# Patient Record
Sex: Male | Born: 1960 | Race: White | Hispanic: No | Marital: Married | Smoking: Never smoker
Health system: Southern US, Community
[De-identification: ages and names within clinical notes are randomized; demographics above are authoritative.]

## PROBLEM LIST (undated history)

## (undated) DIAGNOSIS — N200 Calculus of kidney: Secondary | ICD-10-CM

## (undated) DIAGNOSIS — I10 Essential (primary) hypertension: Secondary | ICD-10-CM

## (undated) DIAGNOSIS — N35919 Unspecified urethral stricture, male, unspecified site: Secondary | ICD-10-CM

## (undated) HISTORY — PX: APPENDECTOMY: SHX54

## (undated) SURGERY — CYSTOSCOPY, WITH URETHRAL DILATION
Anesthesia: Choice

---

## 1999-08-16 ENCOUNTER — Observation Stay (HOSPITAL_COMMUNITY): Admission: RE | Admit: 1999-08-16 | Discharge: 1999-08-17 | Payer: Self-pay | Admitting: Urology

## 1999-08-16 ENCOUNTER — Encounter (INDEPENDENT_AMBULATORY_CARE_PROVIDER_SITE_OTHER): Payer: Self-pay | Admitting: *Deleted

## 1999-08-16 ENCOUNTER — Encounter: Payer: Self-pay | Admitting: Urology

## 2002-09-03 ENCOUNTER — Inpatient Hospital Stay (HOSPITAL_COMMUNITY): Admission: EM | Admit: 2002-09-03 | Discharge: 2002-09-04 | Payer: Self-pay | Admitting: *Deleted

## 2003-12-12 ENCOUNTER — Inpatient Hospital Stay (HOSPITAL_COMMUNITY): Admission: AD | Admit: 2003-12-12 | Discharge: 2003-12-17 | Payer: Self-pay | Admitting: *Deleted

## 2003-12-15 ENCOUNTER — Emergency Department (HOSPITAL_COMMUNITY): Admission: EM | Admit: 2003-12-15 | Discharge: 2003-12-16 | Payer: Self-pay | Admitting: Emergency Medicine

## 2004-01-23 ENCOUNTER — Encounter: Admission: RE | Admit: 2004-01-23 | Discharge: 2004-01-23 | Payer: Self-pay | Admitting: Psychiatry

## 2004-07-30 ENCOUNTER — Ambulatory Visit (HOSPITAL_COMMUNITY): Payer: Self-pay | Admitting: Psychiatry

## 2004-12-08 ENCOUNTER — Ambulatory Visit (HOSPITAL_COMMUNITY): Payer: Self-pay | Admitting: Psychiatry

## 2004-12-09 ENCOUNTER — Emergency Department (HOSPITAL_COMMUNITY): Admission: EM | Admit: 2004-12-09 | Discharge: 2004-12-09 | Payer: Self-pay | Admitting: Emergency Medicine

## 2005-07-13 ENCOUNTER — Ambulatory Visit (HOSPITAL_COMMUNITY): Payer: Self-pay | Admitting: Psychiatry

## 2006-06-21 ENCOUNTER — Ambulatory Visit (HOSPITAL_COMMUNITY): Payer: Self-pay | Admitting: Psychiatry

## 2006-07-07 ENCOUNTER — Ambulatory Visit (HOSPITAL_BASED_OUTPATIENT_CLINIC_OR_DEPARTMENT_OTHER): Admission: RE | Admit: 2006-07-07 | Discharge: 2006-07-07 | Payer: Self-pay | Admitting: Urology

## 2006-12-27 ENCOUNTER — Ambulatory Visit (HOSPITAL_COMMUNITY): Payer: Self-pay | Admitting: Psychiatry

## 2007-07-31 ENCOUNTER — Emergency Department (HOSPITAL_COMMUNITY): Admission: EM | Admit: 2007-07-31 | Discharge: 2007-08-01 | Payer: Self-pay | Admitting: Emergency Medicine

## 2007-08-01 ENCOUNTER — Ambulatory Visit (HOSPITAL_COMMUNITY): Admission: RE | Admit: 2007-08-01 | Discharge: 2007-08-01 | Payer: Self-pay | Admitting: Urology

## 2007-08-01 IMAGING — CT CT PELVIS W/O CM
2 of 3 series · 17 of 46 positions shown, 19 images · non-contrast
Comparison: None

CLINICAL DATA: Right flank pain and dysuria.

ABDOMEN CT WITHOUT CONTRAST
TECHNIQUE: Multidetector CT imaging of the abdomen was performed following the
standard protocol without IV contrast.
TECHNIQUE: Multidetector CT imaging of the pelvis was performed following the

[Series 2: 220 stone 5.0 b40s st · axial · 0.87mm/px · z∈[-506,-158]mm · 14 of 101 slices shown, 16 images]
[im 7/101  soft-tissue]
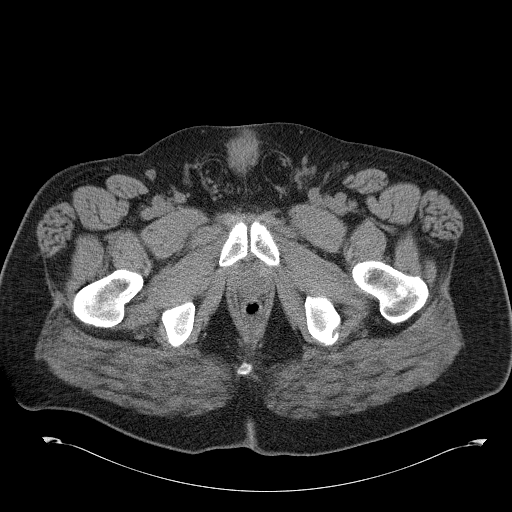
[im 7/101  bone]
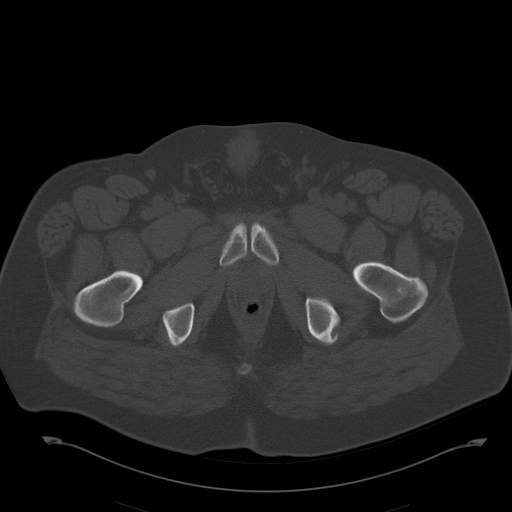
[im 13/101  soft-tissue]
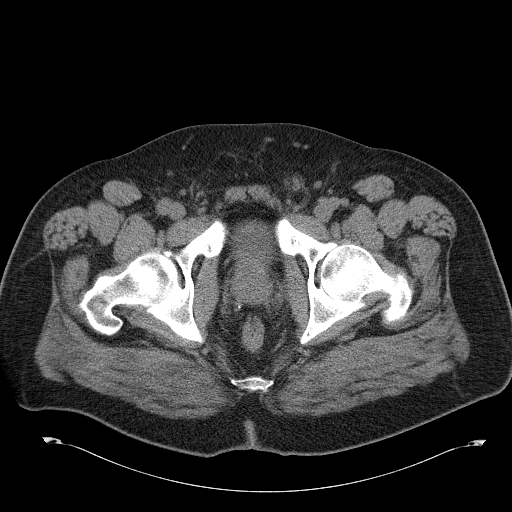
[im 20/101  soft-tissue]
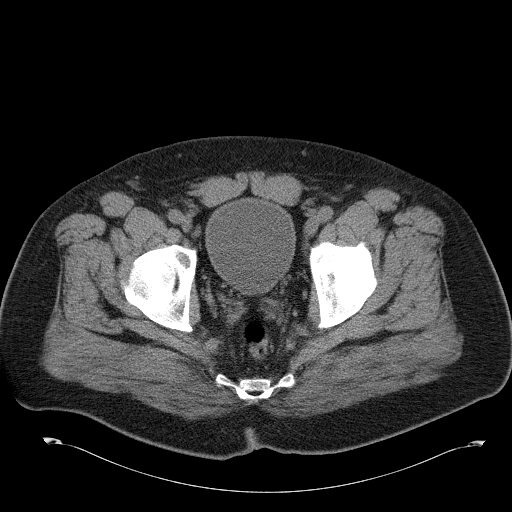
[im 26/101  soft-tissue]
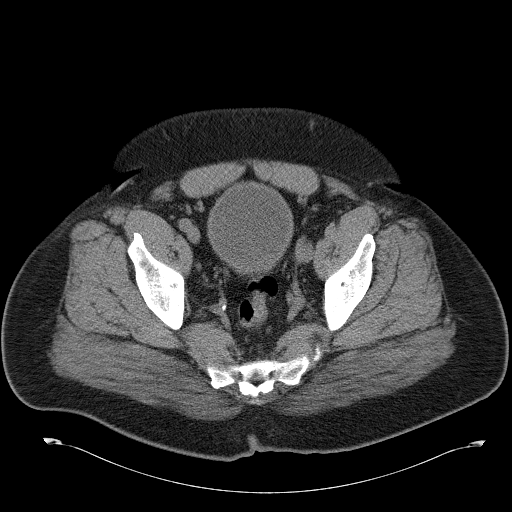
[im 33/101  soft-tissue]
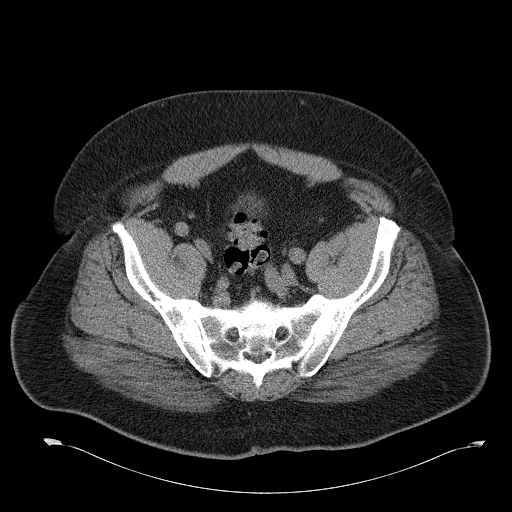
[im 39/101  soft-tissue]
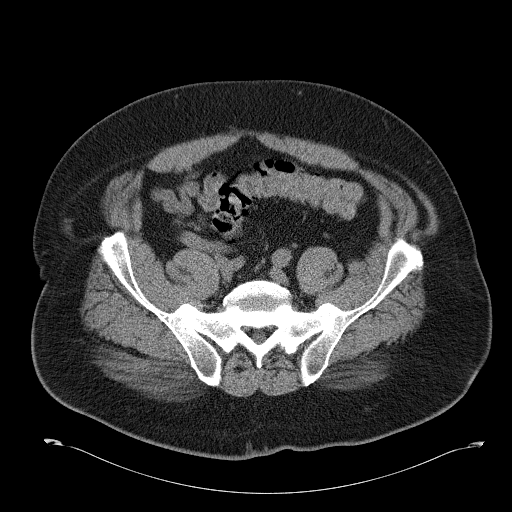
[im 46/101  soft-tissue]
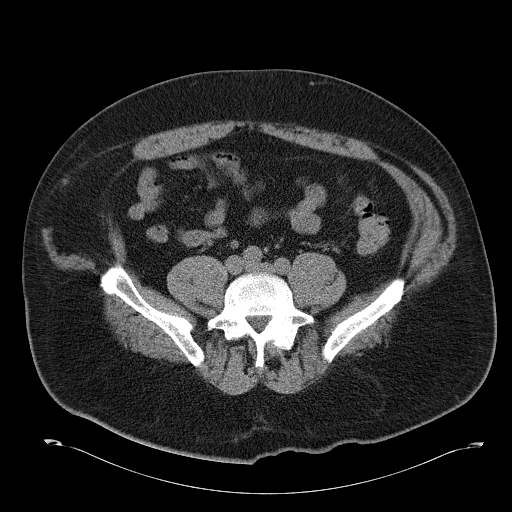
[im 55/101  soft-tissue]
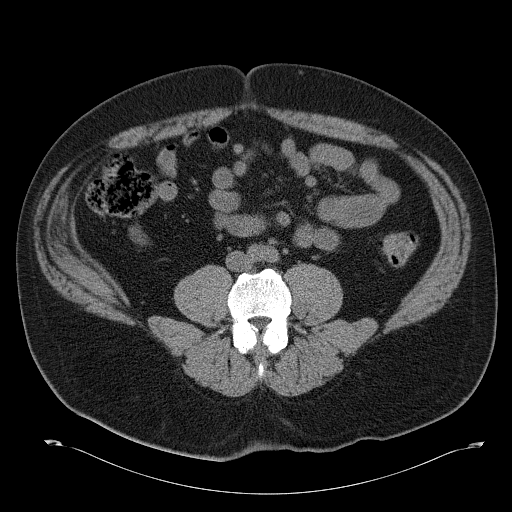
[im 62/101  soft-tissue]
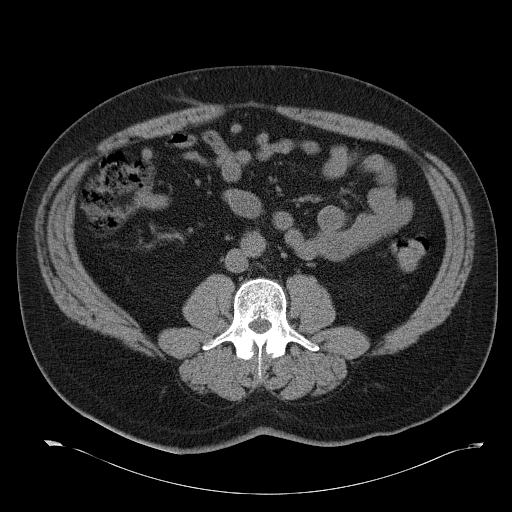
[im 62/101  bone]
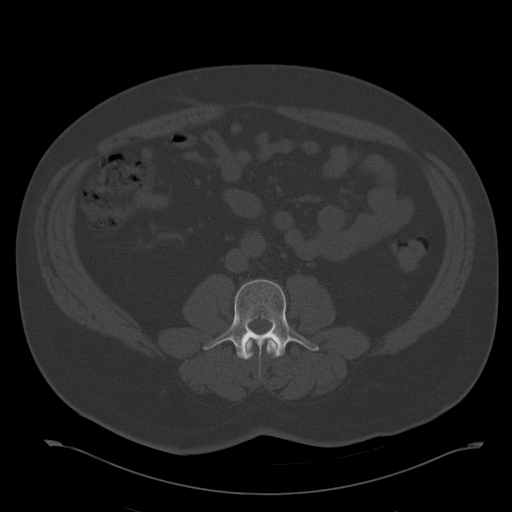
[im 68/101  soft-tissue]
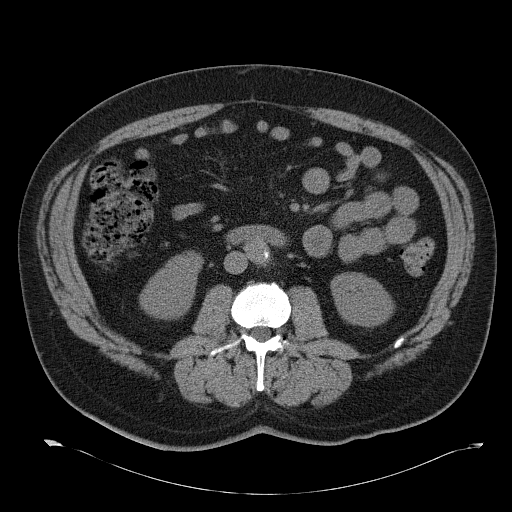
[im 75/101  soft-tissue]
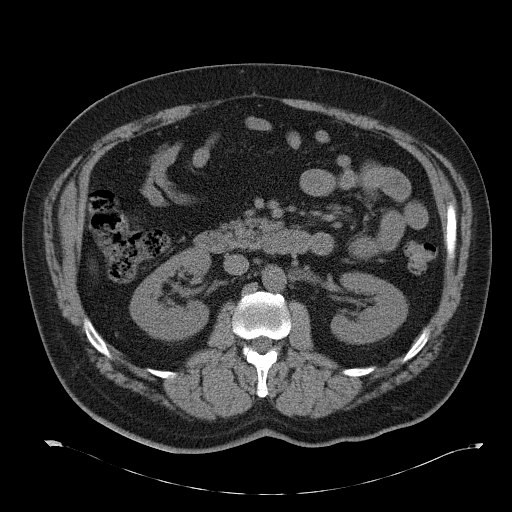
[im 81/101  soft-tissue]
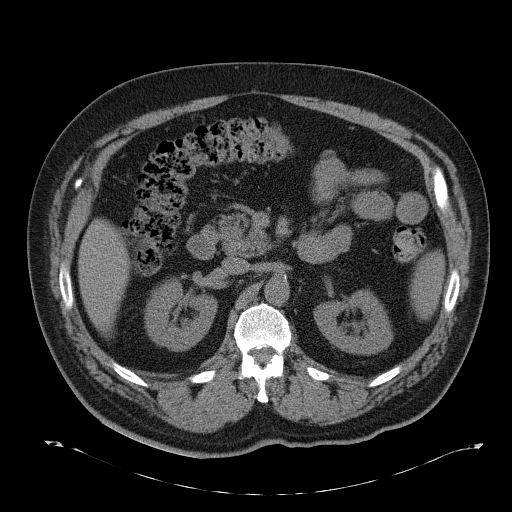
[im 88/101  soft-tissue]
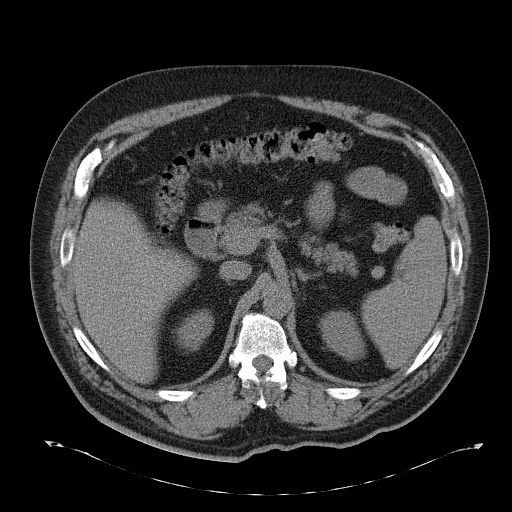
[im 94/101  soft-tissue]
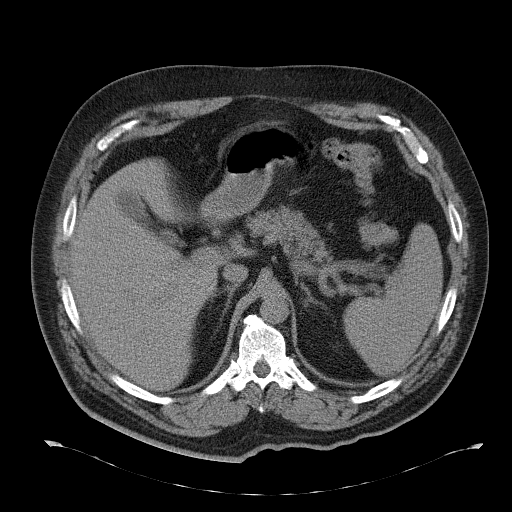

[Series 602: coronal images · coronal · 0.87mm/px · 3 of 90 slices shown]
[im 30/90  soft-tissue]
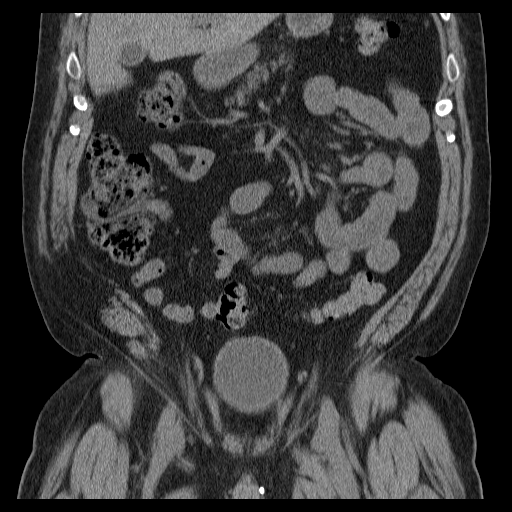
[im 40/90  soft-tissue]
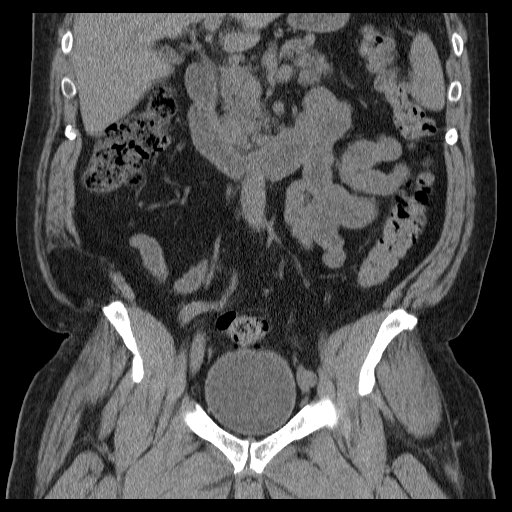
[im 50/90  soft-tissue]
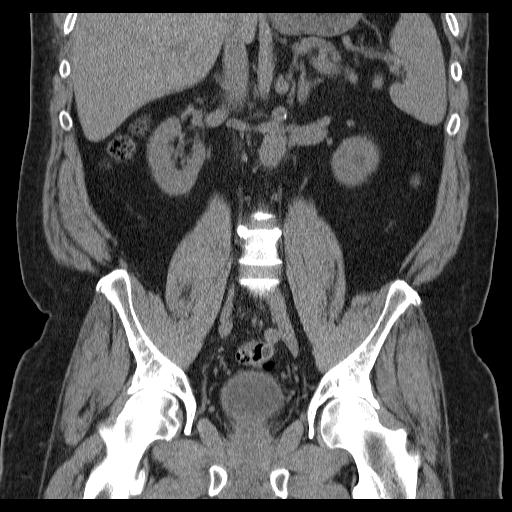

[17 of 46 positions shown; findings below may reference images not displayed]

FINDINGS: The adrenal glands, visualized portion of the liver, visualized
portion of the spleen, and pancreas appear unremarkable in noncontrast CT
appearance. The gallbladder appears mildly contracted. No hydronephrosis or
renal calculi are identified. A faintly hypodense lesion of the left mid kidney
anteriorly measuring 10 mm in diameter is nonspecific. No pathologic
retroperitoneal adenopathy is identified. Tortuosity of the abdominal aorta
noted.

Just above the iliac crest is considerable atrophy of the internal and external
oblique musculature on the right side, with adipose density in this vicinity.
Query prior injury to the lateral abdominal wall musculature in the past. No
herniated bowel is identified.

IMPRESSION

1. Atrophic and indistinct lateral abdominal wall musculature on the right side
inferiorly. Query injury or tear of this musculature in the past.
2. No hydronephrosis, hydroureter, or calculus identified.
3. Faintly hypodense lesion anteriorly in the left mid kidney is probably a
cyst, but technically nonspecific due to the noncontrast nature of today's exam.

PELVIS CT WITHOUT CONTRAST
FINDINGS: The appendix is not well visualized. No definite right lower quadrant
inflammatory process is identified. No distal or neural calculus noted. There is
spurring of the sacroiliac joints. No dilated small bowel. No pathologic pelvic
adenopathy is noted.

IMPRESSION

1. Spurring of both sacroiliac joints.
2. Nonvisualization of the appendix. However, no active right lower quadrant
inflammatory process is identified.

## 2007-08-06 ENCOUNTER — Ambulatory Visit (HOSPITAL_COMMUNITY): Payer: Self-pay | Admitting: Psychiatry

## 2008-06-13 ENCOUNTER — Ambulatory Visit (HOSPITAL_BASED_OUTPATIENT_CLINIC_OR_DEPARTMENT_OTHER): Admission: RE | Admit: 2008-06-13 | Discharge: 2008-06-13 | Payer: Self-pay | Admitting: Urology

## 2009-12-10 ENCOUNTER — Ambulatory Visit (HOSPITAL_BASED_OUTPATIENT_CLINIC_OR_DEPARTMENT_OTHER): Admission: RE | Admit: 2009-12-10 | Discharge: 2009-12-10 | Payer: Self-pay | Admitting: Urology

## 2010-07-04 ENCOUNTER — Ambulatory Visit: Payer: Self-pay | Admitting: Diagnostic Radiology

## 2010-07-04 ENCOUNTER — Emergency Department (HOSPITAL_BASED_OUTPATIENT_CLINIC_OR_DEPARTMENT_OTHER): Admission: EM | Admit: 2010-07-04 | Discharge: 2010-07-05 | Payer: Self-pay | Admitting: Emergency Medicine
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
## 2010-07-05 ENCOUNTER — Ambulatory Visit: Payer: Self-pay | Admitting: Diagnostic Radiology
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
## 2011-01-21 LAB — URINALYSIS, ROUTINE W REFLEX MICROSCOPIC
Hgb urine dipstick: NEGATIVE
Ketones, ur: NEGATIVE mg/dL
Urobilinogen, UA: 1 mg/dL (ref 0.0–1.0)
pH: 6 (ref 5.0–8.0)

## 2011-01-21 LAB — CBC
MCHC: 34.8 g/dL (ref 30.0–36.0)
RBC: 4.43 MIL/uL (ref 4.22–5.81)

## 2011-01-21 LAB — BASIC METABOLIC PANEL
BUN: 13 mg/dL (ref 6–23)
Chloride: 105 mEq/L (ref 96–112)
Creatinine, Ser: 1 mg/dL (ref 0.4–1.5)
GFR calc Af Amer: >60 mL/min (ref >60)
Glucose, Bld: 93 mg/dL (ref 70–99)

## 2011-01-21 LAB — URINE MICROSCOPIC-ADD ON

## 2011-01-26 LAB — POCT I-STAT 4, (NA,K, GLUC, HGB,HCT)
Glucose, Bld: 106 mg/dL — ABNORMAL HIGH (ref 70–99)
HCT: 42 % (ref 39.0–52.0)

## 2011-02-14 ENCOUNTER — Ambulatory Visit (HOSPITAL_COMMUNITY)
Admission: EM | Admit: 2011-02-14 | Discharge: 2011-02-14 | Disposition: A | Discharge status: Home / Self Care | Payer: Federal, State, Local not specified - PPO | Source: Ambulatory Visit | Attending: Urology | Admitting: Urology

## 2011-02-14 DIAGNOSIS — I10 Essential (primary) hypertension: Secondary | ICD-10-CM | POA: Insufficient documentation

## 2011-02-14 DIAGNOSIS — Z01810 Encounter for preprocedural cardiovascular examination: Secondary | ICD-10-CM | POA: Insufficient documentation

## 2011-02-14 DIAGNOSIS — Z01812 Encounter for preprocedural laboratory examination: Secondary | ICD-10-CM | POA: Insufficient documentation

## 2011-02-14 DIAGNOSIS — Z79899 Other long term (current) drug therapy: Secondary | ICD-10-CM | POA: Insufficient documentation

## 2011-02-14 DIAGNOSIS — N35919 Unspecified urethral stricture, male, unspecified site: Secondary | ICD-10-CM | POA: Insufficient documentation

## 2011-02-24 NOTE — Op Note (Signed)
  NAME:  Derek Dean, Derek Dean NO.:  [id number]  MEDICAL RECORD NO.:  [id number]           PATIENT TYPE:  O  LOCATION:  DAY                          FACILITY:  [facility]  PHYSICIAN:  Excell Seltzer. Annabell Howells, M.D.    DATE OF BIRTH:  [birth year]  DATE OF PROCEDURE:  02/14/2011 DATE OF DISCHARGE:                              OPERATIVE REPORT   PROCEDURES: 1. Cystoscopy. 2. Balloon dilation of urethral stricture.  PREOPERATIVE DIAGNOSIS:  Urethral stricture.  POSTOPERATIVE DIAGNOSIS:  Urethral stricture.  SURGEON:  Excell Seltzer. Annabell Howells, M.D.  ANESTHESIA:  General.  DRAIN:  An 18-French Foley catheter.  COMPLICATIONS:  None.  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  He began having increased symptoms over the last few days.  He has taken some Cipro and his urine did not look too bad today but his urethral meatus is largely closed.  FINDINGS OF PROCEDURE:  He was given Cipro and Unasyn and taken to the operating room where general anesthetic was induced.  He was placed in lithotomy position.  His perineum and genitalia were prepped with Betadine solution.  He was draped in usual sterile fashion.  Time-out was performed.  A sensor guidewire was passed through the urethral meatus and advanced sufficient to be in the bladder.  A 5-French open-ended catheter was inserted over the wire and then the wire was removed.  Aspiration through the open-ended catheter produced urine consistent with intravesical positioning.  The wire was then reinserted and the open- ended catheter was removed.  A 15-cm 24-French balloon dilation catheter was then passed over the wire until only the proximal tip protruded from the meatus.  The balloon was then inflated to 25 atmospheres and was held for 3 minutes, then deflated and removed.  Cystoscopy was then performed alongside the  wire.  This revealed disruption of his panurethral stricture disease which exists from the meatus to the membranous urethra.  The prostatic urethra was approximately 2 cm in length with trilobar hyperplasia with minimal obstruction.  Examination of bladder revealed mild trabeculation.  No tumors or stones were noted.  Ureteral orifices were unremarkable.  Once cystoscopy was completed, the guidewire was removed along with the scope and an 18-French Foley catheter was inserted.  A catheter guide was required to negotiate the stricture, it was still somewhat narrow. Once the Foley was in position, the balloon was dilated with 10 cc sterile fluid and placed to straight drainage.  The patient was taken down from lithotomy position.  His anesthetic was reversed, he was moved to the recovery room in stable condition and there were no complications.     Excell Seltzer. Annabell Howells, M.D.     JJW/MEDQ  D:  02/14/2011  T:  02/15/2011  Job:  [id number]  Electronically Signed by Bjorn Pippin M.D. on 02/24/2011 02:15:32 PM

## 2011-03-22 NOTE — Op Note (Signed)
NAME:  Derek Dean, BOYLEN NO.:  [id number]   MEDICAL RECORD NO.:  [id number]          PATIENT TYPE:  AMB   LOCATION:  N[facility]C                         FACILITY:  [facility]   PHYSICIAN:  Maretta Bees. 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   PROCEDURE:  Balloon dilation of urethral meatus and pan-urethra and  cystogram.   SURGEON:  Dr. Larey Dresser.   ANESTHESIA:  General.   INDICATIONS:  This gentleman with a past history of severe urethral  stricture disease and a scarred meatus, has had severe urethral outlet  obstruction and he was seen in the office by Dr. Annabell Howells who cultured his  urine and treated him with antibiotics and asked me to open up his  stricture today.  The patient was brought to the operating room and  placed in lithotomy position.  External genitalia prepped and draped in  usual fashion.  A Glidewire was inserted through the tiny urethral  meatus and under fluoroscopic control, was seen to enter the bladder but  then I did take an open-ended ureteral catheter and inserted that over  the Glidewire and injected contrast and confirmed placement in the  bladder on cystogram view.  Through the open-ended ureteral catheter, I  passed a metal retractable core guidewire and over that, the balloon  dilator was utilized to dilate the distal and proximal urethra to 10  atmospheres of pressure and to 24-French for 3 minutes.  After that,  over the guidewire, I passed a 16-French [place] tip catheter and left  it indwelling in the bladder and it will be left overnight.  The patient  is then taken to the recovery room in  good condition, having tolerated  the procedure well.      Maretta Bees. Vonita Moss, M.D.  Electronically Signed     LJP/MEDQ  D:  06/13/2008  T:  06/13/2008  Job:  [id number]

## 2011-03-22 NOTE — Op Note (Signed)
NAME:  Derek Dean, Derek Dean NO.:  [id number]   MEDICAL RECORD NO.:  [id number]          PATIENT TYPE:  AMB   LOCATION:  DAY                          FACILITY:  [facility]   PHYSICIAN:  Maretta Bees. Vonita Moss, M.D.DATE OF BIRTH:  25-Jul-1961   DATE OF PROCEDURE:  08/01/2007  DATE OF DISCHARGE:  08/01/2007                               OPERATIVE REPORT   PREOPERATIVE DIAGNOSIS:  Meatal stenosis and severe pain, urethral  stricture.   POSTOPERATIVE DIAGNOSIS:  Meatal stenosis and severe pain, urethral  stricture.   PROCEDURE:  Cystoscopy and balloon dilation of pan urethral stricture.   SURGEON:  Dr. Larey Dresser   ANESTHESIA:  General.   INDICATIONS:  This gentleman has had a long history of balanitis  xerotica obliterans and has had pan urethral stricture disease and he  comes in today with problems voiding.  Dr. Annabell Howells asked me to dilate the  stricture after-hours.   PROCEDURE:  The patient brought to the operating room, placed in  lithotomy position.  External genitalia were prepped, draped in usual  fashion.  He has had obvious scarring from balanitis and a very pinpoint  urethral meatus through which I threaded a retractable core guidewire  all the way into the bladder.  With the guidewire in place, I  used the  balloon dilator to dilate him 24-French under 10 atmospheres of pressure  for 3 minutes.  I then cystoscoped him and encountered dense pan  urethral stricture disease.  Prostate was partially obstructed and there  were no other obvious abnormalities.  I then tried to pass another sound  but his stricture was so dense I felt it was not going to be successful.  Therefore I inserted a 18-French coude tip Foley catheter in the bladder  and drained clear urine and connected the catheter to closed drainage.  Plans will be for him to continue his Levaquin, wear the catheter for  two more days.      Maretta Bees. Vonita Moss, M.D.  Electronically Signed     LJP/MEDQ   D:  08/01/2007  T:  08/02/2007  Job:  [id number]

## 2011-03-25 NOTE — Consult Note (Signed)
NAMEROCKET, GUNDERSON NO.:  [id number]   MEDICAL RECORD NO.:  [id number]          PATIENT TYPE:  EMS   LOCATION:  ED                           FACILITY:  [facility]   PHYSICIAN:  Mark C. Vernie Ammons, M.D.  DATE OF BIRTH:  [birth year]   DATE OF CONSULTATION:  12/09/2004  DATE OF DISCHARGE:                                   CONSULTATION   The patient is a 50 year old white male with a long history of stricture  disease. He reports that he has required multiple procedures over many  years.  He said the last time was about 4 or 5 years ago.  He requires  urethral dilatation.  He came in tonight with inability to void. He was only  voiding small frequent amounts.  No fever, chills or hematuria was noted.   His examination reveals a markedly stenotic urethral meatus that is at the  level of the corona with white scar involving the glans and some of the  preputial area although he appears circumcised.  Scrotum, testicles and  epididymis were palpably normal.   Two percent lidocaine jelly was instilled in the urethra and the patient was  administered 125 mg of Demerol after which gentle dilatation of the urethra  using filiformes and followers was performed starting at 10 [place] and  progressing to 81 [place].  I then was able to, with great difficulty, pass a  12 French Foley catheter into the bladder.  I irrigated the catheter and the  patient opted for a catheter plug.   IMPRESSION:  1.  Balanitis xerotica obliterans.  2.  Urinary retention.  3.  Hypospadias.  4.  Meatal stenosis/urethral stricture.   PLAN:  1.  Cipro 500 mg 1/2 pill q.d. #10.  2.  Followup with Dr. Annabell Howells.      MCO/MEDQ  D:  12/09/2004  T:  12/09/2004  Job:  [id number]   cc:   Excell Seltzer. Annabell Howells, M.D.  [address]  [place]  [place] [address]  Fax: [phone]
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
## 2011-03-25 NOTE — Op Note (Signed)
NAME:  Derek Dean, Derek Dean NO.:  [id number]   MEDICAL RECORD NO.:  [id number]          PATIENT TYPE:  AMB   LOCATION:  [facility]                         FACILITY:  [facility]   PHYSICIAN:  Excell Seltzer. Annabell Howells, M.D.    DATE OF BIRTH:  [birth year]   DATE OF PROCEDURE:  07/07/2006  DATE OF DISCHARGE:                                 OPERATIVE REPORT   PROCEDURE:  Urethral balloon dilation and cystoscopy.   PREOPERATIVE DIAGNOSIS:  Urethral stricture disease.   POSTOPERATIVE DIAGNOSIS:  Urethral stricture disease.   SURGEON:  Bjorn Pippin, MD   ANESTHESIA:  General.   COMPLICATIONS:  None.   INDICATIONS:  Derek Dean is a 50 year old white male with pan-urethral stricture  disease and balanitis xerotica obliterans.  He has had a progressive  problems with voiding over the last few weeks.  He last a urethral dilation  2 years ago.  His urine in the office suggested infection; he was given  Rocephin and Levaquin and was brought down emergently for urethral dilation.   FINDINGS AND PROCEDURE:  The patient was given a general anesthetic and  placed in a lithotomy position.  He was prepped with Betadine solution and  draped in the usual sterile fashion.  A 5-French open-ended catheter was  placed per meatus and a sensor guidewire was passed to the bladder; this was  confirmed fluoroscopically.  A 15-cm 24-French high-pressure balloon was  passed over the wire to the level of the bladder neck and dilated to 14  atmospheres.  Fluoroscopy revealed no residual waisting.  The balloon was  then deflated and retrieved across the meatus, where it was reinflated, once  again without residual waisting.  Once the inflation had been performed and  the dilation completed, the balloon was removed along with the wire and a 22-  French cystoscope was passed with a 12-degree lens.  Examination revealed an  excellent dilation of this pan-urethral stricture disease without  significant bleeding.  The bladder had  moderate residual with a smooth wall  and no tumors.  The prostatic urethra was short without obstruction.  The  bladder was drained.  The scope was removed.  A B&O suppository was placed.  The patient was taken down from lithotomy position.  His anesthetic was  reversed.  He was moved to the recovery room in stable condition.  There  were no complications.      Excell Seltzer. Annabell Howells, M.D.  Electronically Signed     JJW/MEDQ  D:  07/07/2006  T:  07/08/2006  Job:  [id number]

## 2011-03-25 NOTE — Cardiovascular Report (Signed)
NAME:  Derek Dean, Derek Dean NO.:  [id number]   MEDICAL RECORD NO.:  [id number]                   PATIENT TYPE:  INP   LOCATION:  6523                                 FACILITY:  [facility]   PHYSICIAN:  Nanetta Batty, MD                  DATE OF BIRTH:  1961-04-26   DATE OF PROCEDURE:  09/03/2002  DATE OF DISCHARGE:                              CARDIAC CATHETERIZATION   PROCEDURE:  Cardiac catheterization.   CARDIOLOGIST:  Nanetta Batty, M.D.   INDICATIONS FOR PROCEDURE:  The patient is a 50 year old white married white  male, father of four, with positive cardiac risk factors.  He has been  complaining of episodic chest pain over the last several months which has  been crescendo in nature.  He presented to the ER this morning with chest  pain.  He had no acute EKG changes.  He was treated with aspirin and  Lovenox.  His initial set of enzymes were negative.  He presents now for  diagnostic coronary arteriography to rule out an ischemic etiology.   DESCRIPTION OF PROCEDURE:  The patient was brought to the second floor [facility] lab in the postabsorptive state.  He was  premedicated with p.o. Valium and IV Versed.  His right groin was prepped  and shaved in the usual sterile fashion.  Then 1% Xylocaine was used for  local anesthesia.  A 6 French sheath was inserted into the right femoral  artery using standard Seldinger technique.  A 6 French right and left  Judkins catheter as well as a 6 French pigtail catheter were used for  selective coronary angiography, left ventriculography and supravalvular  aortography in the LAO cranial view.  Omnipaque dye was used for the  entirety of the case.  Retrograde, aortic and left ventricular pullback  pressures were recorded.   HEMODYNAMICS:  1. Aortic systolic pressure 149, diastolic pressure 62.  2. Left ventricular systolic pressure 150, end-diastolic pressure 22.   SELECTIVE CORONARY  ANGIOGRAPHY:  1. Left main:  Normal.  2. Left anterior descending: Normal.  3. Left circumflex was nondominant and normal.  4. Ramus intermediate branch was small and free of significant disease.  5. Right coronary artery was large dominant and normal.   LEFT VENTRICULOGRAPHY:  The RAO left ventriculogram was performed using 25  cc of Omnipaque dye at 12 cc per second.  The overall LV ejection fraction  was estimated at greater than 60% without focal wall motion abnormalities.   SUPRAVALVULAR AORTOGRAPHY:  This was performed in the LAO cranial view using  20 cc of Omnipaque dye at 20 cc per second.  There was no evidence of aortic  insufficiency.  The caliber of the ascending aorta was normal.  There was no  dissection of it.  Aortic arch vessels were intact.   IMPRESSION AND PLAN:  The patient is a 50 year old gentleman with  symptoms  consistent with unstable angina but with normal coronary arteries by  angiography, normal LV function and no evidence of dissection.  I suspect  his pain is noncardiac and most likely related to GERD.   ACT was measured, and the sheaths were removed.  Pressure was held on the  groin to achieve hemostasis.  The patient left the lab in stable condition.  Anti-reflux measures will be recommended.  The patient will be discharged  home in the morning or at the discretion of the attending physician.  He  left the lab in stable condition.                                               Nanetta Batty, MD    JB/MEDQ  D:  09/03/2002  T:  09/03/2002  Job:  [id number]   cc:   Cardiac Catheterization Lab   [facility]  [address]  [place], [place]   Evelena Peat, MD  [address]  [place]  [place] [address]  Fax: [phone]

## 2011-03-25 NOTE — Discharge Summary (Signed)
NAME:  DAVINCI, GLOTFELTY NO.:  [id number]   MEDICAL RECORD NO.:  [id number]                   PATIENT TYPE:  IPS   LOCATION:  0501                                 FACILITY:  [facility]   PHYSICIAN:  Jeanice Lim, M.D.              DATE OF BIRTH:  June 17, 1961   DATE OF ADMISSION:  12/12/2003  DATE OF DISCHARGE:  12/17/2003                                 DISCHARGE SUMMARY   IDENTIFYING DATA:  This is a 50 year old white married male presenting  yesterday to request help stopping cocaine.  Has been steadily using for the  last 3-4 days.  His wife had come in and accused him of being lazy.  Apparently, she did not realize that if he was not sleeping that he would go  out and use again.  The patient reported some significant conflict with  screaming and verging on the arguments becoming physical.  The patient  initially reported depression and anxiety and now reports that cocaine his a  big part of his mood swings, anxiety, and depression.   ALLERGIES:  SULFA, ERYTHROMYCIN, AND CODEINE.   PHYSICAL EXAMINATION:  Status post appendectomy.  Status post 4-5 surgeries  on urethra.  History of cardiac cath to evaluate episodic chest pain.  He  was found to have normal coronary arteries and likely GERD as a source of  past chest complaints.  NEURO:  Nonfocal.   ROUTINE ADMITTED LABORATORY STUDIES:  Urinalysis negative.  Some crystals  and urine drug screen positive for cocaine and cannabis.  Otherwise, labs  essentially within normal limits.  Glucose slightly elevated at 114.  Liver  function tests within normal limits.   MENTAL STATUS EXAM:  Alert and oriented.  Hygiene within normal limits.  Cooperative.  No psychomotor abnormalities.  Speech not pressured.  Mood  subdued, depressed, feeling guilty.  Affect restricted.  Thought processes  goal-directed.  No acute dangerous ideation or psychotic symptoms.  Cognition intact.  Judgment and insight fair.   ADMISSION DIAGNOSIS:   AXIS I:  1. Polysubstance abuse.  2. Cocaine dependence.  3. Rule out depression, not otherwise specified, versus substance induced     mood disorder.   AXIS II:  Deferred.   AXIS III:  1. Status post cardiac catheterization.  2. Gastroesophageal reflux disease.   AXIS IV:  Moderate stressors related to primary support, conflict with the  relationship.   AXIS V:  40/55-60.   The patient was admitted, ordered routine and p.r.n. medications, underwent  further monitoring, was encouraged to participate in individual, group, and  milieu therapies.  The patient was monitored for withdrawal symptoms and  treated for cravings.  The patient reported significant depression, gradual  decrease in cravings and withdrawal symptoms and stabilization of mood.  Family meeting and aftercare planning was done.  The patient was agitated at  times.  The patient felt wife was trying to sabotage him and  would blame her  for pushing his buttons and causing him to go off and use.  The patient  eventually gained insight regarding his responsibility in this.  The patient  was started on Trileptal and Seroquel for sleep and continued on Neurontin  to stabilize mood instability and acute anxiety.  The patient denied  dangerous ideation and reported motivation to remain abstinent and compliant  with follow up.  The patient was discharged in improved condition with no  dangerous ideation or psychotic symptoms and no acute withdrawal symptoms  after medication education.   MEDICATIONS:  1. Wellbutrin XL 300 mg q. a.m.  2. Clonidine 0.1 mg q.h.s.  3. Neurontin 300 mg 2 q.h.s.  4. Seroquel 25 mg 2 q.h.s.  5. Trileptal 300 mg 1/2 q. a.m. and 1/2 q.h.s.  6. Vistaril 50 mg q.6h. p.r.n. anxiety.   The patient is to follow up with [facility] ([facility]).  The program is every Monday, Tuesday,  Thursday, and Friday for two weeks,  starting at 5:30.   DISCHARGE DIAGNOSES:   AXIS I:  1. Polysubstance abuse.  2. Cocaine dependence.  3. Rule out depression, not otherwise specified, versus substance induced     mood disorder.   AXIS II:  Deferred.   AXIS III:  1. Status post cardiac catheterization.  2. Gastroesophageal reflux disease.   AXIS IV:  Moderate stressors related to primary support, conflict with the  relationship.   AXIS V:  Global assessment of functioning on discharge was 55.                                               Jeanice Lim, M.D.    JEM/MEDQ  D:  01/04/2004  T:  01/05/2004  Job:  [id number]

## 2011-03-25 NOTE — H&P (Signed)
NAME:  Derek Dean, Derek Dean NO.:  [id number]   MEDICAL RECORD NO.:  [id number]                   PATIENT TYPE:  INP   LOCATION:  6523                                 FACILITY:  [facility]   PHYSICIAN:  Evelena Peat, MD                 DATE OF BIRTH:  1961/04/19   DATE OF ADMISSION:  09/03/2002  DATE OF DISCHARGE:                                HISTORY & PHYSICAL   CHIEF COMPLAINT:  Chest pain three times today.   HISTORY OF PRESENT ILLNESS:  This is a 50 year old married white male who  presents to the emergency department at [facility] with three  separate episodes of chest pain the past 24 hours.  His initial episode was  around 11:30 a.m. on 09/02/02 with burning discomfort on the right side of  the chest associated with dyspnea and diaphoresis while walking.  That  episode lasted several minutes and resolved spontaneously.  He had a similar  episode around 2 p.m. at rest today.  Third episode which he describes as  his most severe episode occurred around 4:45 p.m. and was dull to heavy  chest pain which radiated from the right side of the chest to the left side  of chest and again associated with shortness of breath  and diaphoresis.  This episode lasted several hours and prompted his visit to the ED.  He  denies any prior history of cardiac problems.  There is no history of  hypertension or diabetes and he is a nonsmoker.  His lipid status is  unknown.  He does have a family history of premature coronary artery disease  and mother having MI at age 73.  He had not had any recent prior exertional  chest pain prior to yesterday.  He has recently been diagnosed by Dr.  Annalee Genta with what the patient describes as some sort of mass and he points  to the right mastoid area.  He apparently is scheduled to have some sort of  biopsy on this on 09/16/02.  The patient  does relate feeling very anxious  about having this upcoming procedure.   PAST  MEDICAL HISTORY:  1. 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 Workup was advised.  2. Chronic insomnia.   SURGICAL HISTORY:  Appendectomy at age 48, circumcision at age 36.   MEDICATIONS:  1. Tequin 400 mg a day for his ear infection.  2. Temazepam 15 mg one to two q.h.s. p.r.n.  for insomnia.   ALLERGIES:  SULFA AND CODEINE CAUSE LIP SWELLING.  ERYTHROMYCIN, NAUSEA.   FAMILY HISTORY:  Mother died at age 88 of MI.  Father  60 alive and well.  One brother and one sister alive and well.  Paternal grandfather died of MI  late 81s.  Also, had an uncle with MI.  He has a daughter with type 1  diabetes.   SOCIAL HISTORY:  Married.  Has two children from first marriage.  Two  stepchildren.  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  Nonsmoker.  No  alcohol use.   REVIEW OF SYSTEMS:  Reported 30 pound weight loss since 6/03 which he  relates to decreased appetite and chronic vertigo.  He has occasional  headaches right side of the neck which radiate to the temporal region.  He  denies any cough, dyspnea, fever, chills, abdominal pain, dysuria or any  stool changes.   PHYSICAL EXAMINATION:  VITAL SIGNS:  Temperature 99, blood pressure  initially 146/92, pulse 60s, respirations 18 to 20.  O2 saturation is 97% on  room air.  GENERAL:  He is alert in no apparent distress.  He denies any chest pain at  this time.  HEENT:  Pupils equal, round and reactive to light.  Right TM shows minimal  clear effusion on inferior portion of the eardrum.  No erythema. No bulging.  Left TM is normal.  Oropharynx is clear.  NECK:  Supple with no adenopathy.  CHEST:  Clear to auscultation throughout.  HEART:  Regular rhythm and rate with no murmur noted.  ABDOMEN:  Soft and nontender without mass.  EXTREMITIES:  No pitting edema.  2+ dorsalis pedis pulses bilaterally.   LABORATORY DATA:  EKG shows T wave inversions V4 through V6 and also  inferior leads.  There are no old EKGs for comparison.  Sodium  142,  potassium 3.6, glucose 89. CK and troponin levels initially are negative.   IMPRESSION:  A 50 year old white male with somewhat atypical chest pain off  and on for the past 24 hours.  He has some nonspecific ST changes.  His  major risk factor for coronary disease is positive family history.  He is  currently pain free and stable.   PLAN:  Admit.  Rule out myocardial infarction.  Check lipids.  At this point  he is pain free and we will use p.r.n.  nitroglycerin.  Withhold beta  blockers at this time as his heart rate is currently in the 60s.  Consider  cardiology consult as the patient has had no prior cardiac workup.                                               Evelena Peat, MD    BB/MEDQ  D:  09/03/2002  T:  09/03/2002  Job:  [id number]   cc:   Onalee Hua L. Annalee Genta, M.D.

## 2011-08-05 LAB — POCT I-STAT 4, (NA,K, GLUC, HGB,HCT)
Glucose, Bld: 106 — ABNORMAL HIGH
HCT: 46
Hemoglobin: 15.6
Potassium: 4.9
Sodium: 138

## 2011-08-18 LAB — CBC
HCT: 39.4
Hemoglobin: 14

## 2011-08-18 LAB — DIFFERENTIAL
Lymphocytes Relative: 15
Lymphs Abs: 1.2
Monocytes Absolute: 0.9 — ABNORMAL HIGH
Monocytes Relative: 11
Neutro Abs: 5.7
Neutrophils Relative %: 73

## 2011-08-18 LAB — URINE MICROSCOPIC-ADD ON

## 2011-08-18 LAB — URINALYSIS, ROUTINE W REFLEX MICROSCOPIC
Ketones, ur: NEGATIVE
Nitrite: NEGATIVE
Protein, ur: NEGATIVE
pH: 7

## 2011-08-18 LAB — BASIC METABOLIC PANEL
CO2: 23
Calcium: 8.4
Creatinine, Ser: 0.89
Potassium: 3.9

## 2011-08-18 LAB — URINE CULTURE: Colony Count: 45000

## 2011-08-18 LAB — POCT CARDIAC MARKERS: Operator id: 4533

## 2012-02-08 ENCOUNTER — Emergency Department (INDEPENDENT_AMBULATORY_CARE_PROVIDER_SITE_OTHER): Payer: Federal, State, Local not specified - PPO

## 2012-02-08 ENCOUNTER — Emergency Department (HOSPITAL_BASED_OUTPATIENT_CLINIC_OR_DEPARTMENT_OTHER)
Admission: EM | Admit: 2012-02-08 | Discharge: 2012-02-08 | Disposition: A | Discharge status: Home / Self Care | Payer: Federal, State, Local not specified - PPO | Attending: Emergency Medicine | Admitting: Emergency Medicine

## 2012-02-08 ENCOUNTER — Encounter (HOSPITAL_BASED_OUTPATIENT_CLINIC_OR_DEPARTMENT_OTHER): Payer: Self-pay | Admitting: *Deleted

## 2012-02-08 ENCOUNTER — Other Ambulatory Visit: Payer: Self-pay

## 2012-02-08 DIAGNOSIS — I10 Essential (primary) hypertension: Secondary | ICD-10-CM | POA: Insufficient documentation

## 2012-02-08 DIAGNOSIS — Z79899 Other long term (current) drug therapy: Secondary | ICD-10-CM | POA: Insufficient documentation

## 2012-02-08 DIAGNOSIS — R5381 Other malaise: Secondary | ICD-10-CM | POA: Insufficient documentation

## 2012-02-08 DIAGNOSIS — K6389 Other specified diseases of intestine: Secondary | ICD-10-CM

## 2012-02-08 DIAGNOSIS — N390 Urinary tract infection, site not specified: Secondary | ICD-10-CM | POA: Insufficient documentation

## 2012-02-08 DIAGNOSIS — R109 Unspecified abdominal pain: Secondary | ICD-10-CM

## 2012-02-08 DIAGNOSIS — K5730 Diverticulosis of large intestine without perforation or abscess without bleeding: Secondary | ICD-10-CM

## 2012-02-08 DIAGNOSIS — R112 Nausea with vomiting, unspecified: Secondary | ICD-10-CM | POA: Insufficient documentation

## 2012-02-08 DIAGNOSIS — R6883 Chills (without fever): Secondary | ICD-10-CM | POA: Insufficient documentation

## 2012-02-08 DIAGNOSIS — R1110 Vomiting, unspecified: Secondary | ICD-10-CM

## 2012-02-08 DIAGNOSIS — R197 Diarrhea, unspecified: Secondary | ICD-10-CM | POA: Insufficient documentation

## 2012-02-08 HISTORY — DX: Essential (primary) hypertension: I10

## 2012-02-08 HISTORY — DX: Calculus of kidney: N20.0

## 2012-02-08 LAB — URINALYSIS, ROUTINE W REFLEX MICROSCOPIC
Glucose, UA: NEGATIVE mg/dL
Ketones, ur: 15 mg/dL — AB
Protein, ur: 100 mg/dL — AB
Specific Gravity, Urine: 1.03 (ref 1.005–1.030)
pH: 5.5 (ref 5.0–8.0)

## 2012-02-08 LAB — DIFFERENTIAL
Basophils Absolute: 0 10*3/uL (ref 0.0–0.1)
Basophils Relative: 0 % (ref 0–1)
Lymphocytes Relative: 10 % — ABNORMAL LOW (ref 12–46)
Monocytes Absolute: 1.1 10*3/uL — ABNORMAL HIGH (ref 0.1–1.0)
Neutrophils Relative %: 74 % (ref 43–77)

## 2012-02-08 LAB — LACTIC ACID, PLASMA: Lactic Acid, Venous: 1 mmol/L (ref 0.5–2.2)

## 2012-02-08 LAB — URINE MICROSCOPIC-ADD ON

## 2012-02-08 LAB — COMPREHENSIVE METABOLIC PANEL
ALT: 36 U/L (ref 0–53)
Albumin: 4.2 g/dL (ref 3.5–5.2)
GFR calc Af Amer: 88 mL/min — ABNORMAL LOW (ref >90)
Glucose, Bld: 127 mg/dL — ABNORMAL HIGH (ref 70–99)
Sodium: 139 mEq/L (ref 135–145)
Total Bilirubin: 0.8 mg/dL (ref 0.3–1.2)
Total Protein: 7.8 g/dL (ref 6.0–8.3)

## 2012-02-08 LAB — CBC
MCH: 32.2 pg (ref 26.0–34.0)
MCHC: 35.1 g/dL (ref 30.0–36.0)
MCV: 91.7 fL (ref 78.0–100.0)
Platelets: 171 10*3/uL (ref 150–400)
RBC: 5.16 MIL/uL (ref 4.22–5.81)

## 2012-02-08 LAB — LIPASE, BLOOD: Lipase: 37 U/L (ref 11–59)

## 2012-02-08 IMAGING — CT CT ABD-PELV W/ CM
2 of 5 series · 16 of 46 positions shown, 18 images · IV contrast (agent unspecified)
Comparison: CT scan 07/05/2010.

CLINICAL DATA: Abdominal pain.

CT ABDOMEN AND PELVIS WITH CONTRAST
TECHNIQUE: Multidetector CT imaging of the abdomen and pelvis was
performed following the standard protocol during bolus
administration of intravenous contrast.
Contrast:  100 ml Mmnipaque-3RR.

[Series 2: abd/pelvis 5.0 b31f · axial · 0.83mm/px · z∈[+768,+1188]mm · 13 of 96 slices shown, 15 images]
[im 6/96  soft-tissue]
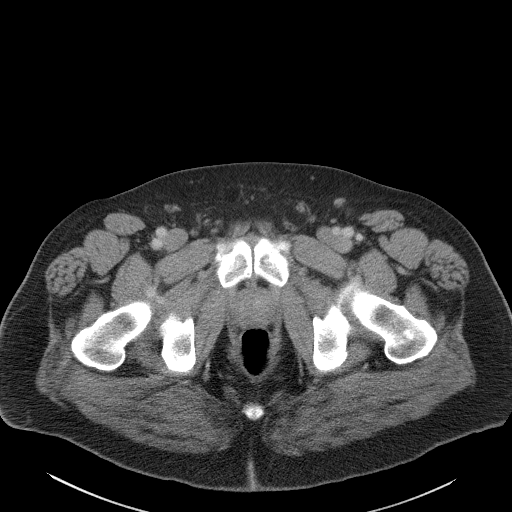
[im 6/96  bone]
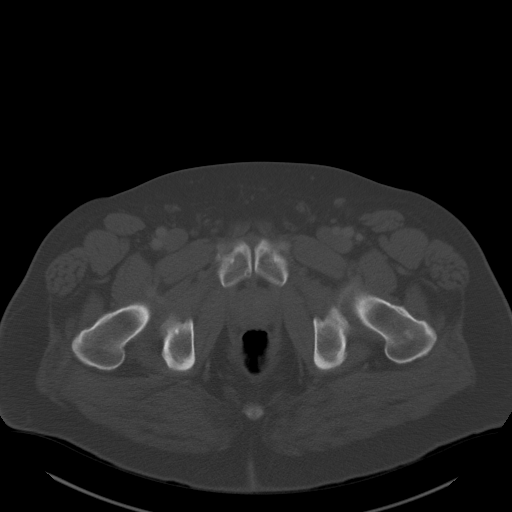
[im 11/96  soft-tissue]
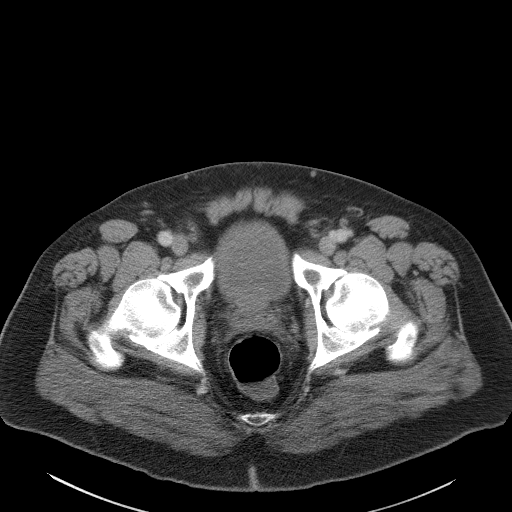
[im 22/96  soft-tissue]
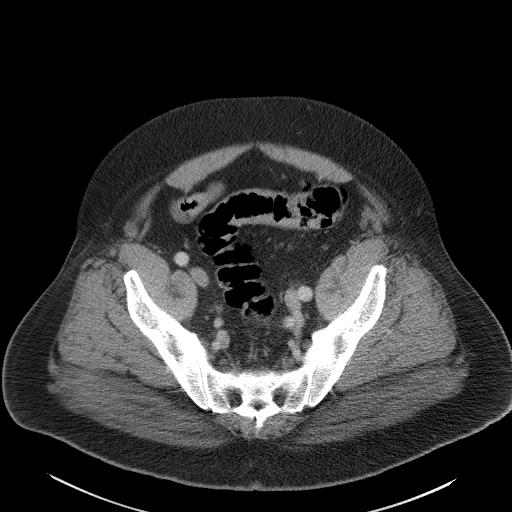
[im 27/96  soft-tissue]
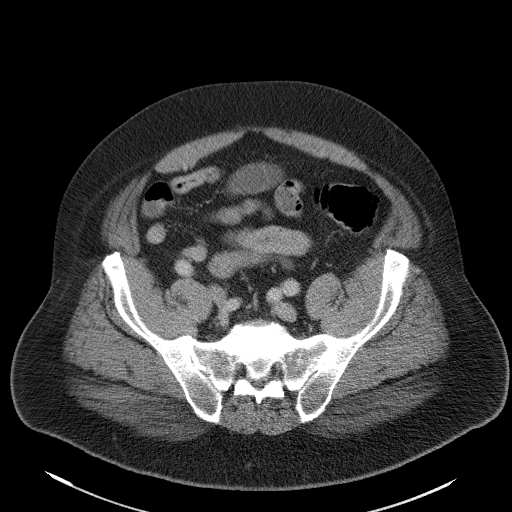
[im 32/96  soft-tissue]
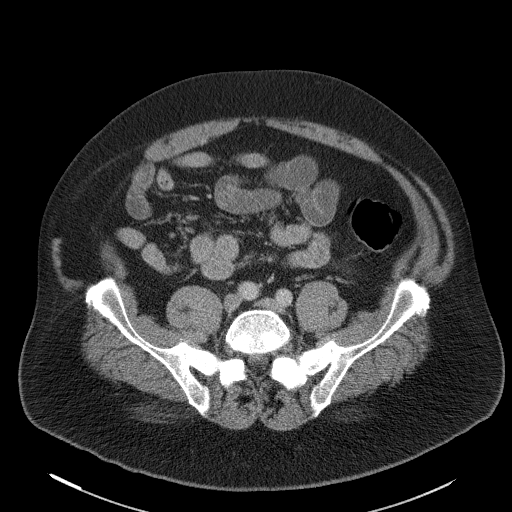
[im 43/96  soft-tissue]
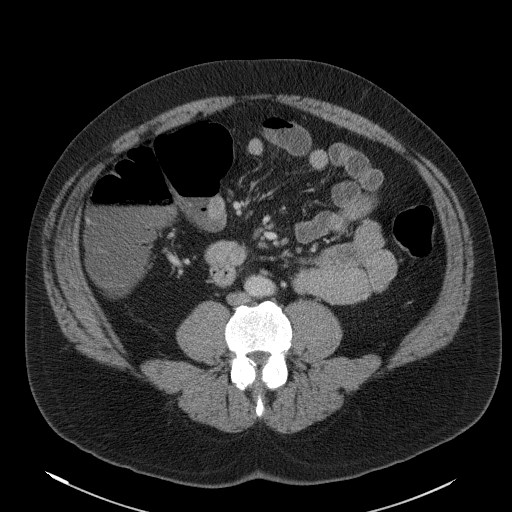
[im 48/96  soft-tissue]
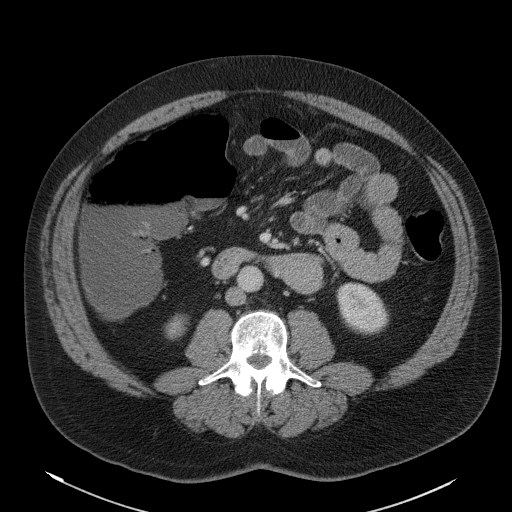
[im 53/96  soft-tissue]
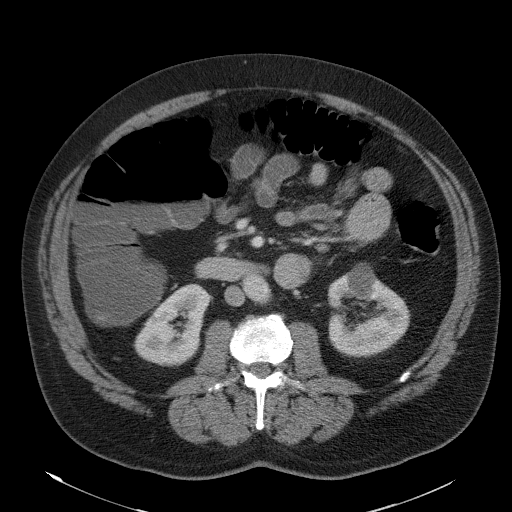
[im 64/96  soft-tissue]
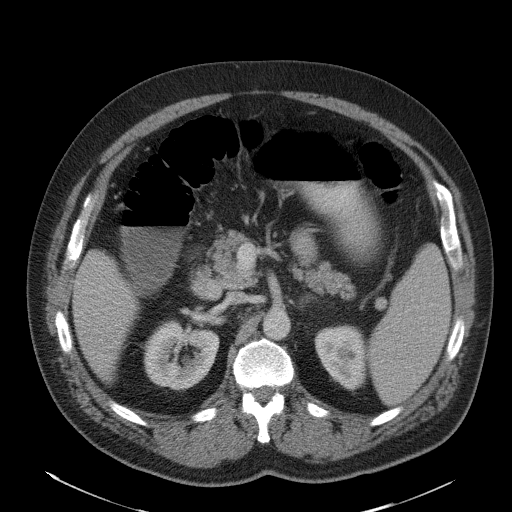
[im 64/96  bone]
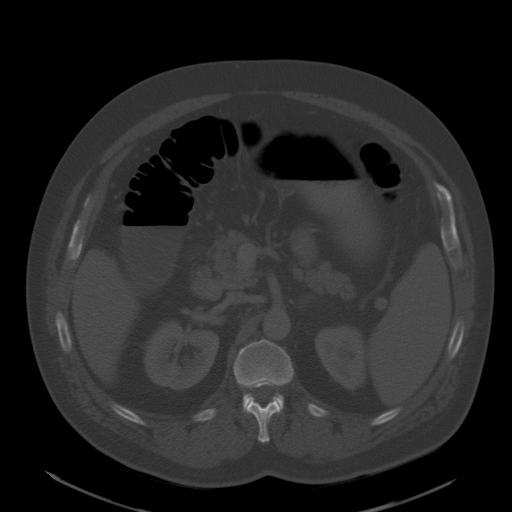
[im 69/96  soft-tissue]
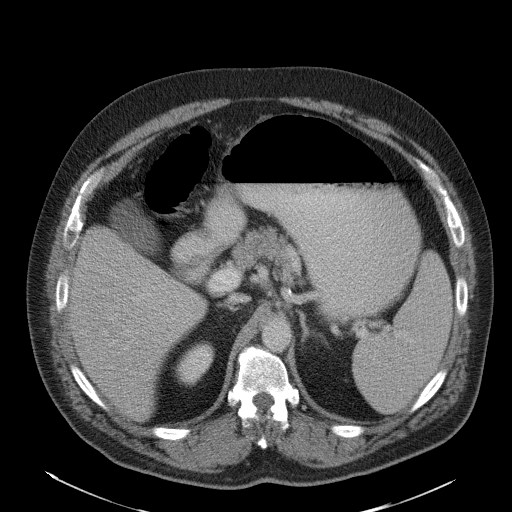
[im 74/96  soft-tissue]
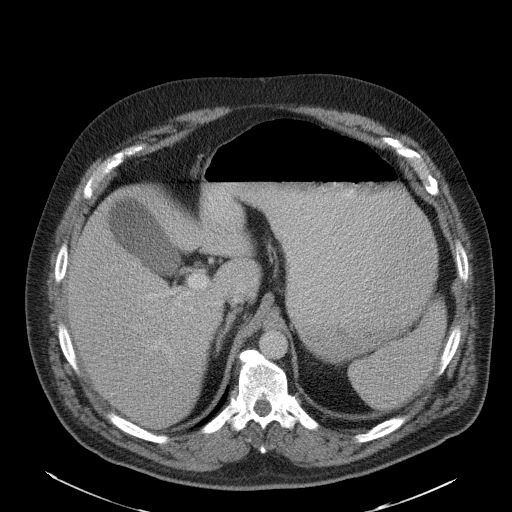
[im 85/96  soft-tissue]
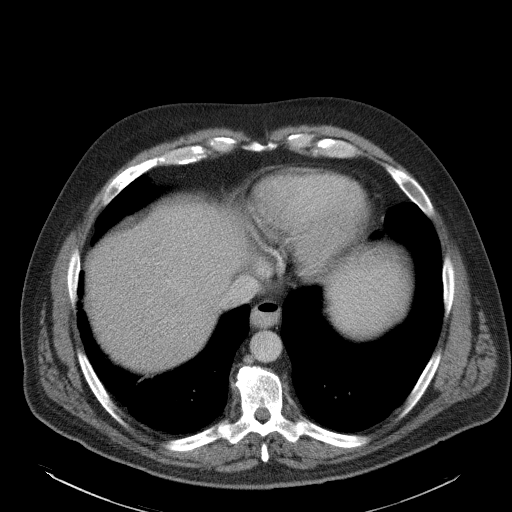
[im 90/96  soft-tissue]
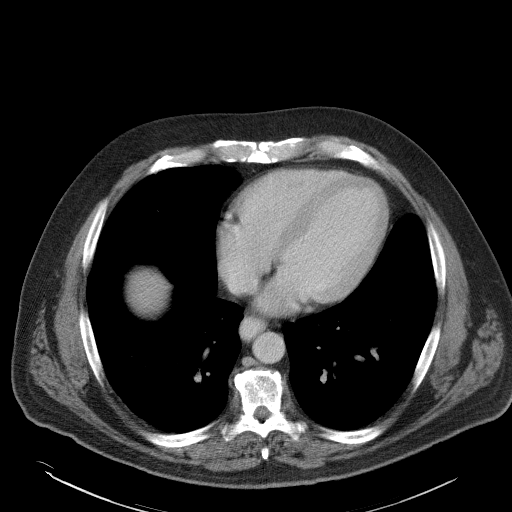

[Series 5: abd/pelvis 3.0 coronal · coronal · 0.81mm/px · 3 of 122 slices shown]
[im 41/122  soft-tissue]
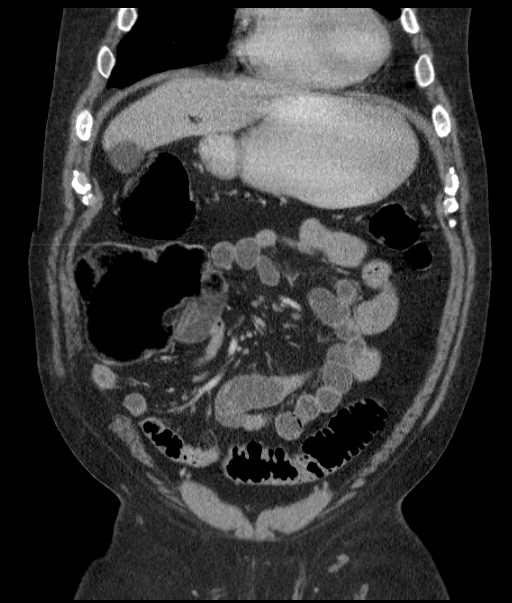
[im 54/122  soft-tissue]
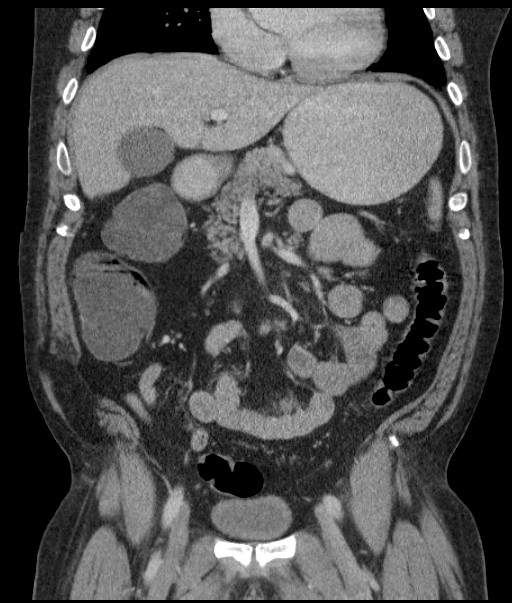
[im 68/122  soft-tissue]
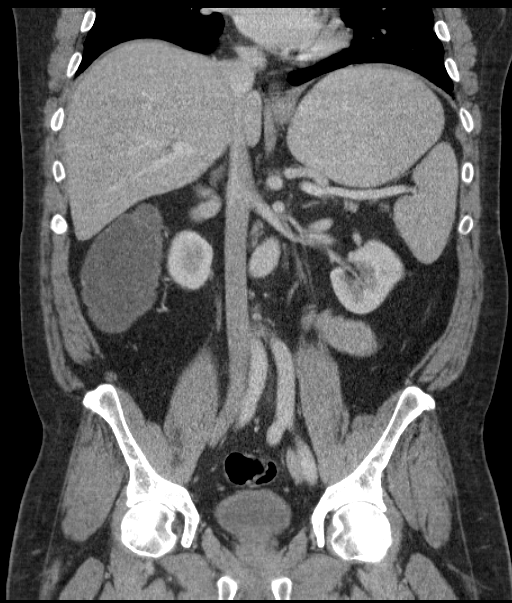

[16 of 46 positions shown; findings below may reference images not displayed]

FINDINGS: The lung bases are clear except for dependent
atelectasis.

The liver is unremarkable.  No focal lesions or intrahepatic
biliary dilatation.  The gallbladder is normal.  No common bile
duct dilatation.  The pancreas is unremarkable.  The spleen is
normal in size.  No focal lesions.  The adrenal glands and kidneys
are unremarkable and stable.  There are small cysts bilaterally.
No hydronephrosis.

The stomach is well descend with contrast.  No abnormalities is
seen.  The duodenum and small bowel are unremarkable.  Normal
caliber and no findings for obstruction.  The right colon is
distended with air and fluid.  The transverse colon is distended
with air.  The left colon is unremarkable except for upper sigmoid
diverticulosis and muscular wall thickening.  No obstructing mass
or wall thickening to suggest inflammation.  The terminal ileum
appears normal.  The appendix is not visualized.

The aorta is normal in caliber.  The major branch vessels are
normal.  No mesenteric or retroperitoneal masses or adenopathy.
The bladder, prostate gland and seminal vesicles are unremarkable.
No pelvic mass, adenopathy or free pelvic fluid collections.  No
inguinal mass or adenopathy.

The bony structures are unremarkable.  Moderate degenerative
changes noted in the thoracic spine.
IMPRESSION: 1.  Distended colon with air-fluid levels in the right colon but no
inflammatory changes or evidence of obstruction.
2.  Mild diverticulosis of the sigmoid colon but no findings for
acute diverticulitis.
3.  No other significant abdominal/pelvic findings.

## 2012-02-08 MED ORDER — OXYCODONE-ACETAMINOPHEN 5-325 MG PO TABS: 1.0000 | ORAL_TABLET | ORAL | Status: AC | PRN

## 2012-02-08 MED ORDER — SODIUM CHLORIDE 0.9 % IV BOLUS (SEPSIS)
1000.0000 mL | Freq: Once | INTRAVENOUS | Status: AC
  Administered 2012-02-08: 1000 mL via INTRAVENOUS

## 2012-02-08 MED ORDER — DEXTROSE 5 % IV SOLN
1.0000 g | Freq: Once | INTRAVENOUS | Status: AC
  Administered 2012-02-08: 1 g via INTRAVENOUS
  Filled 2012-02-08: qty 10

## 2012-02-08 MED ORDER — ONDANSETRON 8 MG PO TBDP: 8.0000 mg | ORAL_TABLET | Freq: Three times a day (TID) | ORAL | Status: AC | PRN

## 2012-02-08 MED ORDER — ONDANSETRON HCL 4 MG/2ML IJ SOLN
4.0000 mg | Freq: Once | INTRAMUSCULAR | Status: AC
  Administered 2012-02-08: 4 mg via INTRAVENOUS
  Filled 2012-02-08: qty 2

## 2012-02-08 MED ORDER — IOHEXOL 300 MG/ML  SOLN: 20.0000 mL | INTRAMUSCULAR | Status: AC

## 2012-02-08 MED ORDER — HYDROMORPHONE HCL PF 1 MG/ML IJ SOLN
1.0000 mg | Freq: Once | INTRAMUSCULAR | Status: AC
  Administered 2012-02-08: 1 mg via INTRAVENOUS
  Filled 2012-02-08: qty 1

## 2012-02-08 MED ORDER — IOHEXOL 300 MG/ML  SOLN
100.0000 mL | Freq: Once | INTRAMUSCULAR | Status: AC | PRN
  Administered 2012-02-08: 100 mL via INTRAVENOUS

## 2012-02-08 MED ORDER — MORPHINE SULFATE 4 MG/ML IJ SOLN
4.0000 mg | Freq: Once | INTRAMUSCULAR | Status: AC
  Administered 2012-02-08: 4 mg via INTRAVENOUS
  Filled 2012-02-08: qty 1

## 2012-02-08 MED ORDER — HYDROMORPHONE HCL PF 1 MG/ML IJ SOLN: 1.0000 mg | Freq: Once | INTRAMUSCULAR | Status: DC

## 2012-02-08 MED ORDER — OXYCODONE-ACETAMINOPHEN 5-325 MG PO TABS
1.0000 | ORAL_TABLET | Freq: Once | ORAL | Status: AC
  Administered 2012-02-08: 1 via ORAL
  Filled 2012-02-08: qty 1

## 2012-02-08 MED ORDER — CEPHALEXIN 500 MG PO CAPS: 500.0000 mg | ORAL_CAPSULE | Freq: Four times a day (QID) | ORAL | Status: AC

## 2012-02-08 NOTE — Discharge Instructions (Signed)

## 2012-02-08 NOTE — ED Notes (Signed)
Pt given room temp gingerale and water and encouraged to take small sips for fluid challenge. 2nd liter bolus started.

## 2012-02-08 NOTE — ED Notes (Signed)
Pt c/o abd pain w n/v/d x 3 days, seen by PMD office today given phenergan inj w/o relief

## 2012-02-08 NOTE — ED Notes (Signed)
Pt unable to provide a stool specimen at this time. States pain is slowly worsening. Percocet administered. Pt instructed to report any worsening symptoms.

## 2012-02-08 NOTE — ED Provider Notes (Signed)
History     CSN: [id number]  Arrival date & time 02/08/12  1814   First MD Initiated Contact with Patient 02/08/12 1840      Chief Complaint  Patient presents with  . Abdominal Pain  . Emesis  . Diarrhea     Patient is a 51 y.o. male presenting with abdominal pain, vomiting, and diarrhea. The history is provided by the patient and the spouse.  Abdominal Pain The primary symptoms of the illness include abdominal pain, fatigue, nausea, vomiting and diarrhea. The primary symptoms of the illness do not include shortness of breath or hematochezia. The current episode started more than 2 days ago. The onset of the illness was gradual. The problem has been rapidly worsening.  Additional symptoms associated with the illness include chills.  Emesis  Associated symptoms include abdominal pain, chills and diarrhea.  Diarrhea The primary symptoms include fatigue, abdominal pain, nausea, vomiting and diarrhea. Primary symptoms do not include hematochezia.  The illness is also significant for chills.  nothing improves his symptoms Eating/drinking worsens his symptoms  pt reports nonbloody vomiting/diarrhea (multiple episodes) for past 3 days He also reports diffuse abd pain.  He reports he has had abd pain on/off for several weeks but the pain is worse in past 24 hours No cp/sob +sick contacts No fever reported He went to PCP office today, given phenergan (labs drawn) but then his symptoms worsened after leaving office   Past Medical History  Diagnosis Date  . Kidney calculi   . Hypertension     Past Surgical History  Procedure Date  . Lithotripsy   . Appendectomy     History reviewed. No pertinent family history.  History  Substance Use Topics  . Smoking status: Never Smoker   . Smokeless tobacco: Not on file  . Alcohol Use: No      Review of Systems  Constitutional: Positive for chills and fatigue.  Respiratory: Negative for shortness of breath.   Gastrointestinal:  Positive for nausea, vomiting, abdominal pain and diarrhea. Negative for hematochezia.  All other systems reviewed and are negative.    Allergies  Codeine and Sulfa antibiotics  Home Medications   Current Outpatient Rx  Name Route Sig Dispense Refill  . DIPHENHYDRAMINE-APAP (SLEEP) 25-500 MG PO TABS Oral Take 1 tablet by mouth at bedtime as needed. Patient used this medication as a sleep aid.    Marland Kitchen LISINOPRIL 10 MG PO TABS Oral Take 10 mg by mouth daily.    Marland Kitchen VIAGRA PO Oral Take 1 tablet by mouth daily as needed.    Marland Kitchen ZOLPIDEM TARTRATE 10 MG PO TABS Oral Take 10 mg by mouth at bedtime as needed.      BP 148/92  Temp(Src) 98.9 F (37.2 C) (Oral)  Resp 16  Ht 5\' 8"  (1.727 m)  Wt 254 lb (115.214 kg)  BMI 38.62 kg/m2  SpO2 100%  Physical Exam CONSTITUTIONAL: Well developed/well nourished HEAD AND FACE: Normocephalic/atraumatic EYES: EOMI/PERRL, no scleral icterus ENMT: Mucous membranes dry NECK: supple no meningeal signs SPINE:entire spine nontender CV: S1/S2 noted, no murmurs/rubs/gallops noted LUNGS: Lungs are clear to auscultation bilaterally, no apparent distress ABDOMEN: soft,LLQ and RLQ tenderness noted, tenderness is moderate.  No rebound/guarding is noted, +BS noted GU:no cva tenderness NEURO: Pt is awake/alert, moves all extremitiesx4 EXTREMITIES: pulses normal, full ROM SKIN: warm, color normal PSYCH: no abnormalities of mood noted  ED Course  Procedures  Labs Reviewed  DIFFERENTIAL - Abnormal; Notable for the following:    Lymphocytes Relative  10 (*)    Monocytes Relative 16 (*)    Monocytes Absolute 1.1 (*)    All other components within normal limits  COMPREHENSIVE METABOLIC PANEL - Abnormal; Notable for the following:    Glucose, Bld 127 (*)    GFR calc non Af Amer 76 (*)    GFR calc Af Amer 88 (*)    All other components within normal limits  CBC  LIPASE, BLOOD  URINALYSIS, ROUTINE W REFLEX MICROSCOPIC  LACTIC ACID, PLASMA   7:02 PM Pt with  vomiting/diarrhea but has significant abd tenderness on exam CT imaging ordered IV fluids and pain meds ordered Will follow closely  9:00 PM Pt improved, resting comfortably Will try PO challenge and PO pain meds BP 127/93  Pulse 77  Temp(Src) 98.9 F (37.2 C) (Oral)  Resp 17  Ht 5\' 8"  (1.727 m)  Wt 254 lb (115.214 kg)  BMI 38.62 kg/m2  SpO2 95%  Pt improved, abd soft, no acute process by CT scan No vomiting Also noted to have uti ,antibiotics given He does not appear septic Discussed strict return precautions  The patient appears reasonably screened and/or stabilized for discharge and I doubt any other medical condition or other [facility] requiring further screening, evaluation, or treatment in the ED at this time prior to discharge.   MDM  Nursing notes reviewed and considered in documentation All labs/vitals reviewed and considered    Date: 02/08/2012  Rate: 82  Rhythm: normal sinus rhythm  QRS Axis: normal  Intervals: normal  ST/T Wave abnormalities: nonspecific ST changes  Conduction Disutrbances:none  Narrative Interpretation:   Old EKG Reviewed: unchanged        Joya Gaskins, MD 02/08/12 2300

## 2012-02-10 LAB — URINE CULTURE

## 2012-02-11 NOTE — ED Notes (Signed)
+  Urine. Patient given Keflex. No sensitivity listed. Chart sent to EDP office for review. °

## 2012-02-13 NOTE — ED Notes (Signed)
Chart back from EDP office... No further instructions necessary

## 2012-08-02 ENCOUNTER — Encounter (HOSPITAL_COMMUNITY): Payer: Self-pay

## 2012-08-02 ENCOUNTER — Emergency Department (HOSPITAL_COMMUNITY)
Admission: EM | Admit: 2012-08-02 | Discharge: 2012-08-03 | Disposition: A | Discharge status: Home / Self Care | Payer: Federal, State, Local not specified - PPO | Attending: Emergency Medicine | Admitting: Emergency Medicine

## 2012-08-02 DIAGNOSIS — N390 Urinary tract infection, site not specified: Secondary | ICD-10-CM

## 2012-08-02 DIAGNOSIS — N3090 Cystitis, unspecified without hematuria: Secondary | ICD-10-CM | POA: Insufficient documentation

## 2012-08-02 DIAGNOSIS — I10 Essential (primary) hypertension: Secondary | ICD-10-CM | POA: Insufficient documentation

## 2012-08-02 DIAGNOSIS — Z882 Allergy status to sulfonamides status: Secondary | ICD-10-CM | POA: Insufficient documentation

## 2012-08-02 DIAGNOSIS — Z87442 Personal history of urinary calculi: Secondary | ICD-10-CM | POA: Insufficient documentation

## 2012-08-02 DIAGNOSIS — Z886 Allergy status to analgesic agent status: Secondary | ICD-10-CM | POA: Insufficient documentation

## 2012-08-02 LAB — URINALYSIS, ROUTINE W REFLEX MICROSCOPIC
Bilirubin Urine: NEGATIVE
Glucose, UA: NEGATIVE mg/dL
Ketones, ur: NEGATIVE mg/dL
Nitrite: NEGATIVE
Protein, ur: >300 mg/dL — AB
Specific Gravity, Urine: 1.024 (ref 1.005–1.030)
Urobilinogen, UA: 1 mg/dL (ref 0.0–1.0)
pH: 6 (ref 5.0–8.0)

## 2012-08-02 LAB — URINE MICROSCOPIC-ADD ON

## 2012-08-02 MED ORDER — ONDANSETRON HCL 4 MG/2ML IJ SOLN
4.0000 mg | Freq: Once | INTRAMUSCULAR | Status: AC
Start: 2012-08-02 — End: 2012-08-02
  Administered 2012-08-02: 4 mg via INTRAVENOUS
  Filled 2012-08-02: qty 2

## 2012-08-02 MED ORDER — HYDROMORPHONE HCL PF 1 MG/ML IJ SOLN
1.0000 mg | Freq: Once | INTRAMUSCULAR | Status: AC
  Administered 2012-08-02: 1 mg via INTRAVENOUS
  Filled 2012-08-02: qty 1

## 2012-08-02 MED ORDER — CIPROFLOXACIN IN D5W 400 MG/200ML IV SOLN
400.0000 mg | Freq: Two times a day (BID) | INTRAVENOUS | Status: DC
  Administered 2012-08-02: 400 mg via INTRAVENOUS
  Filled 2012-08-02: qty 200

## 2012-08-02 MED ORDER — SODIUM CHLORIDE 0.9 % IV BOLUS (SEPSIS)
1000.0000 mL | Freq: Once | INTRAVENOUS | Status: AC
  Administered 2012-08-03: 1000 mL via INTRAVENOUS

## 2012-08-02 MED ORDER — MORPHINE SULFATE 4 MG/ML IJ SOLN
4.0000 mg | Freq: Once | INTRAMUSCULAR | Status: AC
  Administered 2012-08-02: 4 mg via INTRAVENOUS
  Filled 2012-08-02: qty 1

## 2012-08-02 NOTE — ED Notes (Signed)
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  States his urologist is Jonny Ruiz Wrenn-Dr. Kathrynn Running on call tonight-states that they have to call the urologist in to place the foley because of all the scar tissue.  States he is just dribbling urine-last normally voided at 1300 this evening.

## 2012-08-02 NOTE — ED Provider Notes (Signed)
History     CSN: [id number]  Arrival date & time 08/02/12  2009   First MD Initiated Contact with Patient 08/02/12 2047      Chief Complaint  Patient presents with  . Urinary Retention   HPI  History provided by the patient. Patient is a 51 year old male with history of balanitis xerotica obliterans with ureteral stricture, hypertension and kidney stones who presents with complaints of urinary retention. Patient reports having last normal Foley around 1 PM today. Since that time he has very small urinary output but is able to get some drops. Patient states that he has required stretching and dilation in the past with a Foley catheter for his problem. Last time this occurred was 18 months ago. Patient generally follows with Dr. Annabell Howells.  Patient complains of discomfort in the lower abdomen and pelvic area. She denies any flank pain at this time. Denies any fever, chills or sweats. Denies any nausea or vomiting.     Past Medical History  Diagnosis Date  . Kidney calculi   . Hypertension   . Urinary retention     pt has scar tissue in his urethra    Past Surgical History  Procedure Date  . Lithotripsy     stretching of urethra  . Appendectomy     No family history on file.  History  Substance Use Topics  . Smoking status: Never Smoker   . Smokeless tobacco: Not on file  . Alcohol Use: No      Review of Systems  Constitutional: Negative for fever and chills.  Gastrointestinal: Positive for abdominal pain. Negative for nausea and vomiting.  Genitourinary: Positive for decreased urine volume. Negative for dysuria, hematuria, flank pain and discharge.  All other systems reviewed and are negative.    Allergies  Codeine; Erythromycin; and Sulfa antibiotics  Home Medications   Current Outpatient Rx  Name Route Sig Dispense Refill  . LISINOPRIL 10 MG PO TABS Oral Take 10 mg by mouth daily.    Marland Kitchen ZOLPIDEM TARTRATE 10 MG PO TABS Oral Take 10 mg by mouth at bedtime.        BP 139/75  Pulse 61  Temp 98.1 F (36.7 C) (Oral)  Resp 22  Ht 5\' 9"  (1.753 m)  Wt 260 lb (117.935 kg)  BMI 38.40 kg/m2  SpO2 97%  Physical Exam  Nursing note and vitals reviewed. Constitutional: He is oriented to person, place, and time. He appears well-developed and well-nourished.  HENT:  Head: Normocephalic.  Cardiovascular: Normal rate and regular rhythm.   Pulmonary/Chest: Effort normal and breath sounds normal. No respiratory distress. He has no wheezes. He has no rales.  Abdominal: Soft. There is tenderness in the suprapubic area. There is no rebound, no guarding, no CVA tenderness and no tenderness at McBurney's point.  Genitourinary:       Patient with abnormality to penis consistent with hx.  There is mild swelling around the glands and remnants of foreskin.  Urethra lies inferior and difficult to fully visualize.   Neurological: He is alert and oriented to person, place, and time.  Skin: Skin is warm.  Psychiatric: He has a normal mood and affect. His behavior is normal.    ED Course  Procedures   Results for orders placed during the hospital encounter of 08/02/12  URINALYSIS, ROUTINE W REFLEX MICROSCOPIC      Component Value Range   Color, Urine YELLOW  YELLOW   APPearance CLOUDY (*) CLEAR   Specific Gravity, Urine 1.024  1.005 - 1.030   pH 6.0  5.0 - 8.0   Glucose, UA NEGATIVE  NEGATIVE mg/dL   Hgb urine dipstick LARGE (*) NEGATIVE   Bilirubin Urine NEGATIVE  NEGATIVE   Ketones, ur NEGATIVE  NEGATIVE mg/dL   Protein, ur >782 (*) NEGATIVE mg/dL   Urobilinogen, UA 1.0  0.0 - 1.0 mg/dL   Nitrite NEGATIVE  NEGATIVE   Leukocytes, UA MODERATE (*) NEGATIVE  URINE MICROSCOPIC-ADD ON      Component Value Range   Squamous Epithelial / LPF RARE  RARE   WBC, UA 11-20  <3 WBC/hpf   RBC / HPF TOO NUMEROUS TO COUNT  <3 RBC/hpf   Bacteria, UA RARE  RARE   Casts HYALINE CASTS (*) NEGATIVE  CBC WITH DIFFERENTIAL      Component Value Range   WBC 6.3  4.0 - 10.5  K/uL   RBC 4.44  4.22 - 5.81 MIL/uL   Hemoglobin 14.4  13.0 - 17.0 g/dL   HCT 95.6  21.3 - 08.6 %   MCV 93.7  78.0 - 100.0 fL   MCH 32.4  26.0 - 34.0 pg   MCHC 34.6  30.0 - 36.0 g/dL   RDW 57.8  46.9 - 62.9 %   Platelets 158  150 - 400 K/uL   Neutrophils Relative 61  43 - 77 %   Neutro Abs 3.9  1.7 - 7.7 K/uL   Lymphocytes Relative 29  12 - 46 %   Lymphs Abs 1.8  0.7 - 4.0 K/uL   Monocytes Relative 9  3 - 12 %   Monocytes Absolute 0.6  0.1 - 1.0 K/uL   Eosinophils Relative 1  0 - 5 %   Eosinophils Absolute 0.0  0.0 - 0.7 K/uL   Basophils Relative 0  0 - 1 %   Basophils Absolute 0.0  0.0 - 0.1 K/uL  BASIC METABOLIC PANEL      Component Value Range   Sodium 138  135 - 145 mEq/L   Potassium 4.3  3.5 - 5.1 mEq/L   Chloride 103  96 - 112 mEq/L   CO2 30  19 - 32 mEq/L   Glucose, Bld 133 (*) 70 - 99 mg/dL   BUN 12  6 - 23 mg/dL   Creatinine, Ser 5.28  0.50 - 1.35 mg/dL   Calcium 9.1  8.4 - 41.3 mg/dL   GFR calc non Af Amer 77 (*) >90 mL/min   GFR calc Af Amer 89 (*) >90 mL/min         1. UTI (lower urinary tract infection)   2. Cystitis       MDM  9:00 PM patient seen and evaluated. Patient appears in moderate discomfort.  Prior medical records reviewed patient has required operative procedures for dilation of ureteral stricture.  Pt discussed with Attending Physician, will plan to consult urology.   Spoke with Dr. Berneice Heinrich on call for urology. Patient with 79 mL on bladder scan postvoid. Patient only voiding small amounts. Patient does have large amount of hemoglobin and WBCs on UA. At this time Dr. Berneice Heinrich feels patient is not having urinary retention does not require any intervention. Does recommend antibiotics for UTI symptoms. Patient has sulfa allergy and will place on Cipro.      Angus Seller, [place] 08/03/12 [phone]

## 2012-08-03 ENCOUNTER — Other Ambulatory Visit: Payer: Self-pay | Admitting: Urology

## 2012-08-03 LAB — CBC WITH DIFFERENTIAL/PLATELET
Basophils Absolute: 0 10*3/uL (ref 0.0–0.1)
Basophils Relative: 0 % (ref 0–1)
Hemoglobin: 14.4 g/dL (ref 13.0–17.0)
MCHC: 34.6 g/dL (ref 30.0–36.0)
Neutro Abs: 3.9 10*3/uL (ref 1.7–7.7)
Neutrophils Relative %: 61 % (ref 43–77)
Platelets: 158 10*3/uL (ref 150–400)
RDW: 12.9 % (ref 11.5–15.5)

## 2012-08-03 LAB — BASIC METABOLIC PANEL
CO2: 30 mEq/L (ref 19–32)
Calcium: 9.1 mg/dL (ref 8.4–10.5)
GFR calc non Af Amer: 77 mL/min — ABNORMAL LOW (ref >90)
Sodium: 138 mEq/L (ref 135–145)

## 2012-08-03 MED ORDER — CIPROFLOXACIN HCL 500 MG PO TABS: 500.0000 mg | ORAL_TABLET | Freq: Two times a day (BID) | ORAL | Status: DC

## 2012-08-03 MED ORDER — OXYCODONE-ACETAMINOPHEN 5-325 MG PO TABS: 1.0000 | ORAL_TABLET | ORAL | Status: DC | PRN

## 2012-08-03 MED ORDER — PHENAZOPYRIDINE HCL 200 MG PO TABS: 200.0000 mg | ORAL_TABLET | Freq: Three times a day (TID) | ORAL | Status: DC

## 2012-08-03 NOTE — ED Notes (Signed)
Patient is alert and oriented x3.  She was given DC instructions and follow up visit instructions.  Patient gave verbal understanding. She was DC ambulatory under his own power to home.  V/S stable.  He was not showing any signs of distress on DC 

## 2012-08-03 NOTE — ED Provider Notes (Signed)
Medical screening examination/treatment/procedure(s) were performed by non-physician practitioner and as supervising physician I was immediately available for consultation/collaboration.  Carson Meche, MD 08/03/12 1211 

## 2012-08-03 NOTE — ED Notes (Signed)
Patient unable to void 

## 2012-08-05 LAB — URINE CULTURE: Colony Count: >=100000

## 2012-08-06 ENCOUNTER — Encounter (HOSPITAL_BASED_OUTPATIENT_CLINIC_OR_DEPARTMENT_OTHER): Payer: Self-pay | Admitting: Anesthesiology

## 2012-08-06 ENCOUNTER — Encounter (HOSPITAL_BASED_OUTPATIENT_CLINIC_OR_DEPARTMENT_OTHER)
Admission: RE | Disposition: A | Discharge status: Home / Self Care | Payer: Self-pay | Source: Ambulatory Visit | Attending: Urology

## 2012-08-06 ENCOUNTER — Ambulatory Visit (HOSPITAL_BASED_OUTPATIENT_CLINIC_OR_DEPARTMENT_OTHER)
Admission: RE | Admit: 2012-08-06 | Discharge: 2012-08-06 | Disposition: A | Discharge status: Home / Self Care | Payer: Federal, State, Local not specified - PPO | Source: Ambulatory Visit | Attending: Urology | Admitting: Urology

## 2012-08-06 ENCOUNTER — Ambulatory Visit (HOSPITAL_BASED_OUTPATIENT_CLINIC_OR_DEPARTMENT_OTHER): Payer: Federal, State, Local not specified - PPO | Admitting: Anesthesiology

## 2012-08-06 ENCOUNTER — Encounter (HOSPITAL_BASED_OUTPATIENT_CLINIC_OR_DEPARTMENT_OTHER): Payer: Self-pay | Admitting: *Deleted

## 2012-08-06 DIAGNOSIS — N480 Leukoplakia of penis: Secondary | ICD-10-CM | POA: Insufficient documentation

## 2012-08-06 DIAGNOSIS — N390 Urinary tract infection, site not specified: Secondary | ICD-10-CM | POA: Insufficient documentation

## 2012-08-06 DIAGNOSIS — N529 Male erectile dysfunction, unspecified: Secondary | ICD-10-CM | POA: Insufficient documentation

## 2012-08-06 DIAGNOSIS — B951 Streptococcus, group B, as the cause of diseases classified elsewhere: Secondary | ICD-10-CM | POA: Insufficient documentation

## 2012-08-06 DIAGNOSIS — N35919 Unspecified urethral stricture, male, unspecified site: Secondary | ICD-10-CM | POA: Insufficient documentation

## 2012-08-06 DIAGNOSIS — Z79899 Other long term (current) drug therapy: Secondary | ICD-10-CM | POA: Insufficient documentation

## 2012-08-06 HISTORY — PX: BALLOON DILATION: SHX5330

## 2012-08-06 LAB — POCT I-STAT, CHEM 8
HCT: 41 % (ref 39.0–52.0)
Hemoglobin: 13.9 g/dL (ref 13.0–17.0)
Potassium: 4 mEq/L (ref 3.5–5.1)
Sodium: 143 mEq/L (ref 135–145)

## 2012-08-06 SURGERY — CYSTO
Anesthesia: General | Site: Urethra | Wound class: Clean Contaminated

## 2012-08-06 MED ORDER — STERILE WATER FOR IRRIGATION IR SOLN
Status: DC | PRN
  Administered 2012-08-06: 3000 mL

## 2012-08-06 MED ORDER — AMPICILLIN SODIUM 1 G IJ SOLR: 1.0000 g | Freq: Once | INTRAMUSCULAR | Status: DC

## 2012-08-06 MED ORDER — LACTATED RINGERS IV SOLN
INTRAVENOUS | Status: DC
  Administered 2012-08-06: 11:00:00 via INTRAVENOUS

## 2012-08-06 MED ORDER — LIDOCAINE HCL (CARDIAC) 20 MG/ML IV SOLN
INTRAVENOUS | Status: DC | PRN
  Administered 2012-08-06: 60 mg via INTRAVENOUS

## 2012-08-06 MED ORDER — SODIUM CHLORIDE 0.9 % IV SOLN
1.5000 g | INTRAVENOUS | Status: AC
  Administered 2012-08-06: 1.5 g via INTRAVENOUS

## 2012-08-06 MED ORDER — LACTATED RINGERS IV SOLN: INTRAVENOUS | Status: DC

## 2012-08-06 MED ORDER — CIPROFLOXACIN IN D5W 400 MG/200ML IV SOLN: 400.0000 mg | INTRAVENOUS | Status: DC

## 2012-08-06 MED ORDER — FENTANYL CITRATE 0.05 MG/ML IJ SOLN
25.0000 ug | INTRAMUSCULAR | Status: DC | PRN
  Administered 2012-08-06 (×2): 25 ug via INTRAVENOUS

## 2012-08-06 MED ORDER — AMPICILLIN 500 MG PO CAPS: 500.0000 mg | ORAL_CAPSULE | Freq: Four times a day (QID) | ORAL | Status: DC

## 2012-08-06 MED ORDER — MIDAZOLAM HCL 5 MG/5ML IJ SOLN
INTRAMUSCULAR | Status: DC | PRN
  Administered 2012-08-06: 2 mg via INTRAVENOUS

## 2012-08-06 MED ORDER — SODIUM CHLORIDE 0.9 % IV SOLN: 250.0000 mL | INTRAVENOUS | Status: DC | PRN

## 2012-08-06 MED ORDER — ONDANSETRON HCL 4 MG/2ML IJ SOLN: 4.0000 mg | Freq: Four times a day (QID) | INTRAMUSCULAR | Status: DC | PRN

## 2012-08-06 MED ORDER — SODIUM CHLORIDE 0.9 % IJ SOLN: 3.0000 mL | INTRAMUSCULAR | Status: DC | PRN

## 2012-08-06 MED ORDER — ACETAMINOPHEN 650 MG RE SUPP: 650.0000 mg | RECTAL | Status: DC | PRN

## 2012-08-06 MED ORDER — SODIUM CHLORIDE 0.9 % IJ SOLN
3.0000 mL | Freq: Two times a day (BID) | INTRAMUSCULAR | Status: DC
Start: 2012-08-06 — End: 2012-08-06

## 2012-08-06 MED ORDER — DEXAMETHASONE SODIUM PHOSPHATE 4 MG/ML IJ SOLN
INTRAMUSCULAR | Status: DC | PRN
  Administered 2012-08-06: 8 mg via INTRAVENOUS

## 2012-08-06 MED ORDER — ACETAMINOPHEN 325 MG PO TABS: 650.0000 mg | ORAL_TABLET | ORAL | Status: DC | PRN

## 2012-08-06 MED ORDER — PROPOFOL 10 MG/ML IV BOLUS
INTRAVENOUS | Status: DC | PRN
  Administered 2012-08-06: 200 mg via INTRAVENOUS

## 2012-08-06 MED ORDER — IOHEXOL 350 MG/ML SOLN
INTRAVENOUS | Status: DC | PRN
  Administered 2012-08-06: 10 mL via INTRAVENOUS

## 2012-08-06 MED ORDER — FENTANYL CITRATE 0.05 MG/ML IJ SOLN
INTRAMUSCULAR | Status: DC | PRN
  Administered 2012-08-06 (×2): 50 ug via INTRAVENOUS

## 2012-08-06 MED ORDER — ONDANSETRON HCL 4 MG/2ML IJ SOLN
INTRAMUSCULAR | Status: DC | PRN
  Administered 2012-08-06: 4 mg via INTRAVENOUS

## 2012-08-06 MED ORDER — TRAMADOL-ACETAMINOPHEN 37.5-325 MG PO TABS: 1.0000 | ORAL_TABLET | ORAL | Status: DC | PRN

## 2012-08-06 SURGICAL SUPPLY — 23 items
BAG DRAIN URO-CYSTO SKYTR STRL (DRAIN) ×3 IMPLANT
BAG URINE LEG 19OZ MD ST LTX (BAG) ×3 IMPLANT
BALLN NEPHROSTOMY (BALLOONS) ×3
BALLOON NEPHROSTOMY (BALLOONS) ×2 IMPLANT
CANISTER SUCT LVC 12 LTR MEDI- (MISCELLANEOUS) ×3 IMPLANT
CATH FOLEY 2WAY SLVR  5CC 16FR (CATHETERS) ×2
CATH FOLEY 2WAY SLVR 5CC 16FR (CATHETERS) ×4 IMPLANT
CATH URET 5FR 28IN OPEN ENDED (CATHETERS) ×3 IMPLANT
CLOTH BEACON ORANGE TIMEOUT ST (SAFETY) ×3 IMPLANT
DRAPE CAMERA CLOSED 9X96 (DRAPES) ×3 IMPLANT
ELECT REM PT RETURN 9FT ADLT (ELECTROSURGICAL) ×3
ELECTRODE REM PT RTRN 9FT ADLT (ELECTROSURGICAL) ×2 IMPLANT
GLOVE INDICATOR 6.5 STRL GRN (GLOVE) ×3 IMPLANT
GLOVE SURG SS PI 8.0 STRL IVOR (GLOVE) ×3 IMPLANT
GOWN STRL REIN XL XLG (GOWN DISPOSABLE) ×3 IMPLANT
GOWN SURGICAL LARGE (GOWNS) ×3 IMPLANT
NDL SAFETY ECLIPSE 18X1.5 (NEEDLE) ×2 IMPLANT
NEEDLE HYPO 18GX1.5 SHARP (NEEDLE) ×1
NS IRRIG 500ML POUR BTL (IV SOLUTION) IMPLANT
PACK CYSTOSCOPY (CUSTOM PROCEDURE TRAY) ×3 IMPLANT
SYR 30ML LL (SYRINGE) ×3 IMPLANT
SYR 5ML LL (SYRINGE) ×3 IMPLANT
WATER STERILE IRR 3000ML UROMA (IV SOLUTION) ×3 IMPLANT

## 2012-08-06 NOTE — ED Notes (Signed)
+   Urine Chart sent to EDP office for review. 

## 2012-08-06 NOTE — Interval H&P Note (Signed)
History and Physical Interval Note:  08/06/2012 11:25 AM  Derek Dean  has presented today for surgery, with the diagnosis of URETHRAL STRICTURE  The various methods of treatment have been discussed with the patient and family. After consideration of risks, benefits and other options for treatment, the patient has consented to  Procedure(s) (LRB) with comments: CYSTO (N/A) - CYSTO WITH BALLOON DILATION OF STRICTURE  C ARM  BALLOON DILATION (N/A) as a surgical intervention .  The patient's history has been reviewed, patient examined, no change in status, stable for surgery.  I have reviewed the patient's chart and labs.  Questions were answered to the patient's satisfaction.     Shantasia Hunnell J

## 2012-08-06 NOTE — Brief Op Note (Signed)
08/06/2012  12:13 PM  PATIENT:  Derek Dean  51 y.o. male  PRE-OPERATIVE DIAGNOSIS:  URETHRAL STRICTURE  POST-OPERATIVE DIAGNOSIS:  URETHRAL STRICTURE  PROCEDURE:  Procedure(s) (LRB) with comments: CYSTO (N/A) - CYSTO WITH BALLOON DILATION OF STRICTURE  C ARM  BALLOON DILATION (N/A)  SURGEON:  Surgeon(s) and Role:    * Anner Crete, MD - Primary  PHYSICIAN ASSISTANT:   ASSISTANTS: none   ANESTHESIA:   general  EBL:  Total I/O In: 100 [I.V.:100] Out: -   BLOOD ADMINISTERED:none  DRAINS: Urinary Catheter (Foley)   LOCAL MEDICATIONS USED:  NONE  SPECIMEN:  No Specimen  DISPOSITION OF SPECIMEN:  N/A  COUNTS:  YES  TOURNIQUET:  * No tourniquets in log *  DICTATION: .Other Dictation: Dictation Number [id number]  PLAN OF CARE: Discharge to home after PACU  PATIENT DISPOSITION:  PACU - hemodynamically stable.   Delay start of Pharmacological VTE agent (>24hrs) due to surgical blood loss or risk of bleeding: not applicable

## 2012-08-06 NOTE — Anesthesia Procedure Notes (Signed)
Procedure Name: LMA Insertion Date/Time: 08/06/2012 11:52 AM Performed by: Renella Cunas D Pre-anesthesia Checklist: Patient identified, Emergency Drugs available, Suction available and Patient being monitored Patient Re-evaluated:Patient Re-evaluated prior to inductionOxygen Delivery Method: Circle System Utilized Preoxygenation: Pre-oxygenation with 100% oxygen Intubation Type: IV induction Ventilation: Mask ventilation without difficulty LMA: LMA inserted LMA Size: 4.0 Number of attempts: 1 Airway Equipment and Method: bite block Placement Confirmation: positive ETCO2 Tube secured with: Tape Dental Injury: Teeth and Oropharynx as per pre-operative assessment

## 2012-08-06 NOTE — Anesthesia Postprocedure Evaluation (Signed)
  Anesthesia Post-op Note  Patient: Derek Dean  Procedure(s) Performed: Procedure(s) (LRB): CYSTO (N/A) BALLOON DILATION (N/A)  Patient Location: PACU  Anesthesia Type: General  Level of Consciousness: awake and alert   Airway and Oxygen Therapy: Patient Spontanous Breathing  Post-op Pain: mild  Post-op Assessment: Post-op Vital signs reviewed, Patient's Cardiovascular Status Stable, Respiratory Function Stable, Patent Airway and No signs of Nausea or vomiting  Post-op Vital Signs: stable  Complications: No apparent anesthesia complications

## 2012-08-06 NOTE — H&P (Signed)
  ctive Problems Problems  1. 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 Urethral Stricture 598.9 4. Urinary Tract Infection 599.0  History of Present Illness         Derek Dean returns today with recurrent voiding symptoms.  He was seen in the ER recently and was started on antibiotics. He has had progressively severe difficulty voiding with a reduced stream and dysuria.   He is having back pain.  He has a history of BXO with distal strictures and has to be dilated about once a year.  He denies fever or hematuria.   Past Medical History Problems  1. History of  Acute Urinary Retention 788.20 2. History of  Hypertension 401.9  Surgical History Problems  1. History of  Circumcision No Clamp/Device/Dorsal Slit Older Than 28 Days 2. History of  Cystoscopy For Urethral Stricture 3. History of  Cystoscopy For Urethral Stricture 4. History of  Cystoscopy With Dilation Of Bladder 5. History of  Cystoscopy With Dilation Of Bladder  Current Meds 1. Ambien 10 MG Oral Tablet; Therapy: (Recorded:09Apr2012) to 2. Lisinopril 10 MG Oral Tablet; Therapy: (Recorded:09Apr2012) to 3. Viagra 100 MG Oral Tablet; Take 1 tablet as needed; Therapy: 22Dec2009 to  (Evaluate:21Jan2010); Last Rx:22Dec2009 4. Viagra 100 MG Oral Tablet; TAKE 100 MG As needed; Therapy: 22Jun2012 to (Last  Rx:22Jun2012)  Allergies Medication  1. Codeine Derivatives 2. Erythromycin Base CPEP 3. Sulfa Drugs  Family History Problems  1. Maternal history of  Death In The Family Mother Heart DiseaseAge of death was 39 2. Family history of  Family Health Status Number Of Children Two daughters 3. Maternal history of  Heart Disease V17.49 4. Maternal history of  Renal Failure  Social History Problems  1. Caffeine Use One per day 2. Marital History - Currently Married 3. 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 Alcohol Use 6. Tobacco Use  Review of Systems Genitourinary,  constitutional, skin, eye, otolaryngeal, hematologic/lymphatic, cardiovascular, pulmonary, endocrine, musculoskeletal, gastrointestinal, neurological and psychiatric system(s) were reviewed and pertinent findings if present are noted.  Genitourinary: urinary frequency, dysuria and penile pain.  Constitutional: no fever.  Cardiovascular: no chest pain.  Respiratory: no shortness of breath.  Musculoskeletal: back pain.    Physical Exam Constitutional: Well nourished and well developed . No acute distress.  Pulmonary: No respiratory distress and normal respiratory rhythm and effort.  Cardiovascular: Heart rate and rhythm are normal . No peripheral edema.  Genitourinary: Examination of the penis demonstrates balanitis and meatal stenosis.    Assessment Assessed  1. Balanitis Xerotica Obliterans 607.81 2. Urethral Stricture 598.9 3. Urinary Tract Infection 599.0       His stricture has recurred and is being treated for a UTI.   Plan      I am going to set him up for dilation and cystoscopy.  He is aware of the risks.

## 2012-08-06 NOTE — ED Notes (Signed)
Patient  Admitted for surgery.

## 2012-08-06 NOTE — Transfer of Care (Signed)
Immediate Anesthesia Transfer of Care Note  Patient: Derek Dean  Procedure(s) Performed: Procedure(s) (LRB): CYSTO (N/A) BALLOON DILATION (N/A)  Patient Location: PACU  Anesthesia Type: General  Level of Consciousness: awake, oriented, sedated and patient cooperative  Airway & Oxygen Therapy: Patient Spontanous Breathing and Patient connected to face mask oxygen  Post-op Assessment: Report given to PACU RN and Post -op Vital signs reviewed and stable  Post vital signs: Reviewed and stable  Complications: No apparent anesthesia complications

## 2012-08-06 NOTE — Anesthesia Preprocedure Evaluation (Signed)
Anesthesia Evaluation  Patient identified by MRN, date of birth, ID band Patient awake    Reviewed: Allergy & Precautions, H&P , NPO status , Patient's Chart, lab work & pertinent test results  Airway Mallampati: II TM Distance: >3 FB Neck ROM: full    Dental  (+) Missing and Dental Advisory Given,    Pulmonary neg pulmonary ROS,  breath sounds clear to auscultation  Pulmonary exam normal       Cardiovascular Exercise Tolerance: Good hypertension, Pt. on medications Rhythm:regular Rate:Normal     Neuro/Psych negative neurological ROS  negative psych ROS   GI/Hepatic negative GI ROS, Neg liver ROS,   Endo/Other  negative endocrine ROS  Renal/GU negative Renal ROS  negative genitourinary   Musculoskeletal   Abdominal   Peds  Hematology negative hematology ROS (+)   Anesthesia Other Findings   Reproductive/Obstetrics negative OB ROS                           Anesthesia Physical Anesthesia Plan  ASA: II  Anesthesia Plan: General   Post-op Pain Management:    Induction:   Airway Management Planned: LMA  Additional Equipment:   Intra-op Plan:   Post-operative Plan:   Informed Consent: I have reviewed the patients History and Physical, chart, labs and discussed the procedure including the risks, benefits and alternatives for the proposed anesthesia with the patient or authorized representative who has indicated his/her understanding and acceptance.   Dental Advisory Given  Plan Discussed with: CRNA and Surgeon  Anesthesia Plan Comments:         Anesthesia Quick Evaluation

## 2012-08-07 ENCOUNTER — Encounter (HOSPITAL_BASED_OUTPATIENT_CLINIC_OR_DEPARTMENT_OTHER): Payer: Self-pay | Admitting: Urology

## 2012-08-07 NOTE — Op Note (Signed)
NAMEALIOU, MEALEY NO.:  [id number]  MEDICAL RECORD NO.:  [id number]  LOCATION:                               FACILITY:  [facility]  PHYSICIAN:  Excell Seltzer. Annabell Howells, M.D.    DATE OF BIRTH:  1961-01-28  DATE OF PROCEDURE:  08/06/2012 DATE OF DISCHARGE:                              OPERATIVE REPORT   PROCEDURE:  Cystoscopy with balloon, dilation of panurethral stricture disease.  PREOPERATIVE DIAGNOSIS:  Panurethral stricture disease with infection.  POSTOPERATIVE DIAGNOSIS:  Panurethral stricture disease with infection.  SURGEON:  Excell Seltzer. Annabell Howells, M.D.  ANESTHESIA:  General.  SPECIMEN:  None.  DRAINS:  A 16-French Foley catheter.  COMPLICATIONS:  None.  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  He recently presented to the emergency room with the UTI and near retention.  He had a urine culture that was group beta strep which is routinely sensitive to penicillins.  He had been on antibiotics and was given Unasyn preoperatively.  He was taken to the operating room where general anesthetic was induced.  He was placed in lithotomy position.  His perineum and genitalia were prepped with Betadine solution, and he was draped in usual sterile fashion.  His urethral meatus was difficult to identify, but was eventually found by probing with the tip of a hemostat.  I then passed a Sensor guidewire, but was unable to advance it to the bladder.  I then inserted a 5-French open-end catheter and instill contrast.  Retrograde pyelogram revealed a very narrowed ratty ureter from the meatus to the bulb.  The prostatic urethra was unremarkable and contrast flowed into the bladder without difficulty.  Once the anatomy had been delineated, a guidewire was passed through the open-end catheter.  The penis was put on greater stretch and the wire was  passed into the bladder.  The open-end catheter was removed.  A 24-French 15-cm high-pressure balloon was passed over the wire until the tip was right in the non-involved portion of the bulbar urethra right at the membranous sphincter.  The balloon was then inflated to 18 atmospheres of pressure with disappearance of the waist.  This was left inflated for 3 minutes and the balloon was deflated.  At this point, cystoscopy was performed using a 17-French scope with a 12-degree lens.  Examination revealed panurethral stricture disease with disruption of scar throughout the length of the urethra.  There was normal mucosa starting at approximately the membranous urethra.  The prostatic urethra was short with bilobar hyperplasia with minimal obstruction.  Examination of the bladder revealed some patchy erythema on the posterior wall possibly from the wire and hemorrhagic cystitis is also possibility.  No tumors or stones were noted.  Ureteral orifices were unremarkable.  At this point, the cystoscope was removed along with the wire and attempt was made to pass a 16-French Foley catheter.  This was initially unsuccessful, but with the aid of a catheter guide, I was able to stiffen the catheter sufficiently to get it into the bladder.  Once in the bladder, the balloon was filled with  10 mL of sterile fluid. The catheter was placed to straight drainage.  The patient was taken down from lithotomy position.  His anesthetic was reversed.  He was moved to the recovery room in stable condition.  He will keep the catheter for minimum of 3 days and will follow up in 2 weeks in the office.     Excell Seltzer. Annabell Howells, M.D.     JJW/MEDQ  D:  08/06/2012  T:  08/06/2012  Job:  [id number]

## 2013-05-20 ENCOUNTER — Emergency Department (HOSPITAL_COMMUNITY)
Admission: EM | Admit: 2013-05-20 | Discharge: 2013-05-20 | Disposition: A | Discharge status: Home / Self Care | Payer: Federal, State, Local not specified - PPO | Attending: Urology | Admitting: Urology

## 2013-05-20 ENCOUNTER — Encounter (HOSPITAL_COMMUNITY)
Admission: EM | Disposition: A | Discharge status: Home / Self Care | Payer: Self-pay | Source: Home / Self Care | Attending: Emergency Medicine

## 2013-05-20 ENCOUNTER — Emergency Department (HOSPITAL_COMMUNITY): Payer: Federal, State, Local not specified - PPO | Admitting: Anesthesiology

## 2013-05-20 ENCOUNTER — Encounter (HOSPITAL_COMMUNITY): Payer: Self-pay | Admitting: Anesthesiology

## 2013-05-20 ENCOUNTER — Encounter (HOSPITAL_COMMUNITY): Payer: Self-pay | Admitting: *Deleted

## 2013-05-20 DIAGNOSIS — N35919 Unspecified urethral stricture, male, unspecified site: Secondary | ICD-10-CM | POA: Insufficient documentation

## 2013-05-20 HISTORY — PX: CYSTOSCOPY WITH URETHRAL DILATATION: SHX5125

## 2013-05-20 HISTORY — DX: Unspecified urethral stricture, male, unspecified site: N35.919

## 2013-05-20 LAB — CBC WITH DIFFERENTIAL/PLATELET
Eosinophils Absolute: 0.1 10*3/uL (ref 0.0–0.7)
Eosinophils Relative: 1 % (ref 0–5)
Hemoglobin: 15.9 g/dL (ref 13.0–17.0)
Lymphs Abs: 2 10*3/uL (ref 0.7–4.0)
MCH: 33.2 pg (ref 26.0–34.0)
MCV: 92.9 fL (ref 78.0–100.0)
Monocytes Absolute: 1.2 10*3/uL — ABNORMAL HIGH (ref 0.1–1.0)
Monocytes Relative: 14 % — ABNORMAL HIGH (ref 3–12)
RBC: 4.79 MIL/uL (ref 4.22–5.81)

## 2013-05-20 LAB — POCT I-STAT, CHEM 8
Calcium, Ion: 1.26 mmol/L — ABNORMAL HIGH (ref 1.12–1.23)
Creatinine, Ser: 1.2 mg/dL (ref 0.50–1.35)
Glucose, Bld: 119 mg/dL — ABNORMAL HIGH (ref 70–99)
Hemoglobin: 16 g/dL (ref 13.0–17.0)
TCO2: 24 mmol/L (ref 0–100)

## 2013-05-20 SURGERY — CYSTOSCOPY, WITH URETHRAL DILATION
Anesthesia: General | Site: Urethra | Wound class: Clean Contaminated

## 2013-05-20 MED ORDER — HYDROMORPHONE HCL 2 MG PO TABS: 2.0000 mg | ORAL_TABLET | ORAL | Status: DC | PRN

## 2013-05-20 MED ORDER — ACETAMINOPHEN 650 MG RE SUPP
650.0000 mg | RECTAL | Status: DC | PRN
  Filled 2013-05-20: qty 1

## 2013-05-20 MED ORDER — MEPERIDINE HCL 50 MG/ML IJ SOLN: 6.2500 mg | INTRAMUSCULAR | Status: DC | PRN

## 2013-05-20 MED ORDER — ACETAMINOPHEN 325 MG PO TABS: 650.0000 mg | ORAL_TABLET | ORAL | Status: DC | PRN

## 2013-05-20 MED ORDER — FENTANYL CITRATE 0.05 MG/ML IJ SOLN
25.0000 ug | INTRAMUSCULAR | Status: DC | PRN
  Administered 2013-05-20 (×3): 50 ug via INTRAVENOUS

## 2013-05-20 MED ORDER — FENTANYL CITRATE 0.05 MG/ML IJ SOLN
INTRAMUSCULAR | Status: AC
  Filled 2013-05-20: qty 2

## 2013-05-20 MED ORDER — CIPROFLOXACIN IN D5W 400 MG/200ML IV SOLN
INTRAVENOUS | Status: DC | PRN
  Administered 2013-05-20: 400 mg via INTRAVENOUS

## 2013-05-20 MED ORDER — SODIUM CHLORIDE 0.9 % IJ SOLN: 3.0000 mL | Freq: Two times a day (BID) | INTRAMUSCULAR | Status: DC

## 2013-05-20 MED ORDER — 0.9 % SODIUM CHLORIDE (POUR BTL) OPTIME
TOPICAL | Status: DC | PRN
  Administered 2013-05-20: 1000 mL

## 2013-05-20 MED ORDER — SODIUM CHLORIDE 0.9 % IJ SOLN: 3.0000 mL | INTRAMUSCULAR | Status: DC | PRN

## 2013-05-20 MED ORDER — FENTANYL CITRATE 0.05 MG/ML IJ SOLN
INTRAMUSCULAR | Status: DC | PRN
  Administered 2013-05-20 (×2): 50 ug via INTRAVENOUS

## 2013-05-20 MED ORDER — OXYCODONE HCL 5 MG PO TABS: 5.0000 mg | ORAL_TABLET | ORAL | Status: DC | PRN

## 2013-05-20 MED ORDER — PROMETHAZINE HCL 25 MG/ML IJ SOLN: 6.2500 mg | INTRAMUSCULAR | Status: DC | PRN

## 2013-05-20 MED ORDER — HYDRALAZINE HCL 20 MG/ML IJ SOLN
5.0000 mg | INTRAMUSCULAR | Status: AC | PRN
  Administered 2013-05-20 (×4): 5 mg via INTRAVENOUS

## 2013-05-20 MED ORDER — SODIUM CHLORIDE 0.9 % IV SOLN: 250.0000 mL | INTRAVENOUS | Status: DC | PRN

## 2013-05-20 MED ORDER — LIDOCAINE HCL (CARDIAC) 20 MG/ML IV SOLN
INTRAVENOUS | Status: DC | PRN
  Administered 2013-05-20: 100 mg via INTRAVENOUS

## 2013-05-20 MED ORDER — FENTANYL CITRATE 0.05 MG/ML IJ SOLN: 25.0000 ug | INTRAMUSCULAR | Status: DC | PRN

## 2013-05-20 MED ORDER — PROPOFOL 10 MG/ML IV BOLUS
INTRAVENOUS | Status: DC | PRN
  Administered 2013-05-20: 280 mg via INTRAVENOUS

## 2013-05-20 MED ORDER — STERILE WATER FOR IRRIGATION IR SOLN
Status: DC | PRN
  Administered 2013-05-20: 3000 mL via INTRAVESICAL

## 2013-05-20 MED ORDER — ACETAMINOPHEN 10 MG/ML IV SOLN
1000.0000 mg | Freq: Once | INTRAVENOUS | Status: DC | PRN
  Filled 2013-05-20: qty 100

## 2013-05-20 MED ORDER — ONDANSETRON HCL 4 MG/2ML IJ SOLN: 4.0000 mg | Freq: Four times a day (QID) | INTRAMUSCULAR | Status: DC | PRN

## 2013-05-20 MED ORDER — SODIUM CHLORIDE 0.9 % IV SOLN: INTRAVENOUS | Status: DC

## 2013-05-20 MED ORDER — CIPROFLOXACIN IN D5W 400 MG/200ML IV SOLN
INTRAVENOUS | Status: AC
  Filled 2013-05-20: qty 200

## 2013-05-20 MED ORDER — CIPROFLOXACIN HCL 500 MG PO TABS: 500.0000 mg | ORAL_TABLET | Freq: Two times a day (BID) | ORAL | Status: DC

## 2013-05-20 MED ORDER — HYDRALAZINE HCL 20 MG/ML IJ SOLN
INTRAMUSCULAR | Status: AC
  Filled 2013-05-20: qty 1

## 2013-05-20 MED ORDER — IOHEXOL 300 MG/ML  SOLN
INTRAMUSCULAR | Status: AC
  Filled 2013-05-20: qty 1

## 2013-05-20 MED ORDER — SODIUM CHLORIDE 0.9 % IV SOLN
INTRAVENOUS | Status: DC | PRN
  Administered 2013-05-20: 21:00:00 via INTRAVENOUS

## 2013-05-20 MED ORDER — SODIUM CHLORIDE 0.9 % IV SOLN
Freq: Once | INTRAVENOUS | Status: AC
  Administered 2013-05-20: 20 mL/h via INTRAVENOUS

## 2013-05-20 SURGICAL SUPPLY — 13 items
BAG URO CATCHER STRL LF (DRAPE) ×2 IMPLANT
BALLN NEPHROSTOMY (BALLOONS) ×2
BALLOON NEPHROSTOMY (BALLOONS) ×1 IMPLANT
CATH FOLEY 2W COUNCIL 5CC 18FR (CATHETERS) ×2 IMPLANT
CATH ROBINSON RED A/P 16FR (CATHETERS) IMPLANT
CLOTH BEACON ORANGE TIMEOUT ST (SAFETY) ×2 IMPLANT
DRAPE CAMERA CLOSED 9X96 (DRAPES) ×2 IMPLANT
GLOVE SURG SS PI 8.0 STRL IVOR (GLOVE) ×2 IMPLANT
GOWN PREVENTION PLUS XLARGE (GOWN DISPOSABLE) IMPLANT
GOWN STRL REIN XL XLG (GOWN DISPOSABLE) ×4 IMPLANT
MANIFOLD NEPTUNE II (INSTRUMENTS) ×2 IMPLANT
PACK CYSTO (CUSTOM PROCEDURE TRAY) ×2 IMPLANT
TUBING CONNECTING 10 (TUBING) ×2 IMPLANT

## 2013-05-20 NOTE — Anesthesia Postprocedure Evaluation (Signed)
Anesthesia Post Note  Patient: Derek Dean  Procedure(s) Performed: Procedure(s) (LRB): CYSTOSCOPY WITH URETHRAL DILATATION (N/A)  Anesthesia type: General  Patient location: PACU  Post pain: Pain level controlled  Post assessment: Post-op Vital signs reviewed  Last Vitals: BP 157/106  Pulse 66  Temp(Src) 36.7 C (Oral)  Resp 20  Ht 5\' 9"  (1.753 m)  Wt 268 lb (121.564 kg)  BMI 39.56 kg/m2  SpO2 100%  Post vital signs: Reviewed  Level of consciousness: sedated  Complications: No apparent anesthesia complications

## 2013-05-20 NOTE — ED Notes (Signed)
Patient transported to OR.

## 2013-05-20 NOTE — Preoperative (Signed)
Beta Blockers   Reason not to administer Beta Blockers:Not Applicable 

## 2013-05-20 NOTE — Transfer of Care (Signed)
Immediate Anesthesia Transfer of Care Note  Patient: Derek Dean  Procedure(s) Performed: Procedure(s): CYSTOSCOPY WITH URETHRAL DILATATION (N/A)  Patient Location: PACU  Anesthesia Type:General  Level of Consciousness: awake, alert  and oriented  Airway & Oxygen Therapy: Patient Spontanous Breathing and Patient connected to face mask oxygen  Post-op Assessment: Report given to PACU RN and Post -op Vital signs reviewed and stable  Post vital signs: Reviewed and stable  Complications: No apparent anesthesia complications

## 2013-05-20 NOTE — ED Notes (Signed)
Pt states has history of urinary strictures with dilation; states since 11 has had pain and unable to urinate; abd pain

## 2013-05-20 NOTE — H&P (Signed)
     Subjective: Derek Dean returns with recurrent retention.  He has a history of urethral stricture disease and has been slowing down for about 2 weeks and finally shut down today around 11.   He is in pain with bladder spasms.  ROS: Negative except He has had some rectal urgency.  He denies fever.  He denies CP or SOB.   He has some back pain.  Past Medical History  Diagnosis Date  . Kidney calculi   . Hypertension   . Urethral stricture    Past Surgical History  Procedure Laterality Date  . Lithotripsy  1 year ago    stretching of urethra  . Appendectomy  38 years ago  . Balloon dilation  08/06/2012    Procedure: BALLOON DILATION;  Surgeon: Anner Crete, MD;  Location: [facility];  Service: Urology;  Laterality: N/A;   History   Social History  . Marital Status: Married    Spouse Name: N/A    Number of Children: N/A  . Years of Education: N/A   Occupational History  . Not on file.   Social History Main Topics  . Smoking status: Never Smoker   . Smokeless tobacco: Not on file  . Alcohol Use: No  . Drug Use: No  . Sexually Active: No   Other Topics Concern  . Not on file   Social History Narrative  . No narrative on file  No family history on file. Allergies  Allergen Reactions  . Codeine Anaphylaxis  . Erythromycin     Swell lips and throat  . Sulfa Antibiotics Anaphylaxis     Objective: Vital signs in last 24 hours: Temp:  [98.1 F (36.7 C)] 98.1 F (36.7 C) (07/14 1935) Pulse Rate:  [66] 66 (07/14 1935) Resp:  [20] 20 (07/14 1935) BP: (157)/(106) 157/106 mmHg (07/14 1935) SpO2:  [100 %] 100 % (07/14 1935) Weight:  [121.564 kg (268 lb)] 121.564 kg (268 lb) (07/14 1935)  Intake/Output from previous day:   Intake/Output this shift:    General appearance: alert and in mod distress.  Resp: clear to auscultation bilaterally Cardio: regularly irregular rhythm GI: soft and obese with bladder tenderness. Male genitalia: severe meatal  stenosis with BXO.   Lab Results:  No results found for this basename: WBC, HGB, HCT, PLT,  in the last 72 hours BMET No results found for this basename: NA, K, CL, CO2, GLUCOSE, BUN, CREATININE, CALCIUM,  in the last 72 hours PT/INR No results found for this basename: LABPROT, INR,  in the last 72 hours ABG No results found for this basename: PHART, PCO2, PO2, HCO3,  in the last 72 hours  Studies/Results: No results found.  Anti-infectives: Anti-infectives   None      No current facility-administered medications for this encounter.   Current Outpatient Prescriptions  Medication Sig Dispense Refill  . lisinopril (PRINIVIL,ZESTRIL) 10 MG tablet Take 10 mg by mouth daily.      . sildenafil (VIAGRA) 100 MG tablet Take 100 mg by mouth as needed for erectile dysfunction.      Marland Kitchen zolpidem (AMBIEN) 10 MG tablet Take 10 mg by mouth at bedtime.         Assessment: Recurrent urethral stricture disease with BXO  Plan: I will take him to the OR for dilation.  The risks were reviewed.   CC: Betsey Holiday PA.     LOS: 0 days    Juliany Daughety J 05/20/2013

## 2013-05-20 NOTE — ED Notes (Signed)
No urine collected.  Patient not able to pass.

## 2013-05-20 NOTE — Brief Op Note (Signed)
05/20/2013  9:44 PM  PATIENT:  Piedad Climes Donathan  52 y.o. male  PRE-OPERATIVE DIAGNOSIS:  Urethral stricture  POST-OPERATIVE DIAGNOSIS:  status post operative urethral dilatation  PROCEDURE:  Procedure(s): CYSTOSCOPY WITH URETHRAL DILATATION (N/A)  SURGEON:  Surgeon(s) and Role:    * Anner Crete, MD - Primary  PHYSICIAN ASSISTANT:   ASSISTANTS: none   ANESTHESIA:   general  EBL:  Total I/O In: -  Out: 1000 [Urine:1000]  BLOOD ADMINISTERED:none  DRAINS: Urinary Catheter (Foley)   LOCAL MEDICATIONS USED:  NONE  SPECIMEN:  Source of Specimen:  urine from bladder  DISPOSITION OF SPECIMEN:  PATHOLOGY for culture.  COUNTS:  YES  TOURNIQUET:  * No tourniquets in log *  DICTATION: .Other Dictation: Dictation Number [id number]  PLAN OF CARE: Discharge to home after PACU  PATIENT DISPOSITION:  PACU - hemodynamically stable.   Delay start of Pharmacological VTE agent (>24hrs) due to surgical blood loss or risk of bleeding: not applicable

## 2013-05-20 NOTE — Anesthesia Preprocedure Evaluation (Signed)
Anesthesia Evaluation  Patient identified by MRN, date of birth, ID band Patient awake    Reviewed: Allergy & Precautions, H&P , NPO status , Patient's Chart, lab work & pertinent test results  Airway Mallampati: II TM Distance: >3 FB Neck ROM: full    Dental  (+) Missing and Dental Advisory Given,    Pulmonary neg pulmonary ROS,  breath sounds clear to auscultation  Pulmonary exam normal       Cardiovascular Exercise Tolerance: Good hypertension, Pt. on medications Rhythm:regular Rate:Normal     Neuro/Psych negative neurological ROS  negative psych ROS   GI/Hepatic negative GI ROS, Neg liver ROS,   Endo/Other  negative endocrine ROSMorbid obesity  Renal/GU Renal disease  negative genitourinary   Musculoskeletal   Abdominal   Peds  Hematology negative hematology ROS (+)   Anesthesia Other Findings   Reproductive/Obstetrics negative OB ROS                           Anesthesia Physical  Anesthesia Plan  ASA: III and emergent  Anesthesia Plan: General   Post-op Pain Management:    Induction: Intravenous  Airway Management Planned:   Additional Equipment:   Intra-op Plan:   Post-operative Plan: Extubation in OR  Informed Consent: I have reviewed the patients History and Physical, chart, labs and discussed the procedure including the risks, benefits and alternatives for the proposed anesthesia with the patient or authorized representative who has indicated his/her understanding and acceptance.   Dental advisory given  Plan Discussed with: CRNA  Anesthesia Plan Comments:         Anesthesia Quick Evaluation

## 2013-05-21 ENCOUNTER — Encounter (HOSPITAL_COMMUNITY): Payer: Self-pay | Admitting: Urology

## 2013-05-21 NOTE — Op Note (Signed)
NAMEGEDALYA, JIM NO.:  [id number]  MEDICAL RECORD NO.:  [id number]  LOCATION:  [facility]                         FACILITY:  [facility]  PHYSICIAN:  Excell Seltzer. Annabell Howells, M.D.    DATE OF BIRTH:  [birth year]  DATE OF PROCEDURE:  05/20/2013 DATE OF DISCHARGE:  05/20/2013                              OPERATIVE REPORT   PROCEDURE:  Procedure cystoscopy with urethral dilation.  PREOPERATIVE DIAGNOSIS:  Recurrent urethral stricture disease.  POSTOPERATIVE DIAGNOSIS:  Recurrent urethral stricture disease.  SURGEON:  Excell Seltzer. Annabell Howells, MD  ANESTHESIA:  General.  SPECIMEN:  Urine for culture.  DRAINS:  An 18-French Council catheter.  BLOOD LOSS:  Minimal.  COMPLICATIONS:  None.  INDICATIONS:  Abhi is a 52 year old white male with a long history of panurethral stricture disease, balanitis xerotica obliterans.  He has recurring bouts of urinary retention for which he presents acutely in distress.  This has occurred again today despite slowing of his stream for 2 weeks.  He waited until today to notify us.  FINDINGS AND PROCEDURE:  He was given Cipro 400 mg IV.  He was taken to the operating room, where general anesthetic was induced.  He was placed in lithotomy position.  His perineum and genitalia were prepped with Betadine solution.  He was draped in usual sterile fashion.  An initial attempt with a 12-French dilator was made to identify the urethral meatus.  This passed relatively easily to the proximal urethra. He was then sequentially dilated to 24-French with male sounds but it became progressively more and more difficult to get the sounds beyond about the distal 3rd of the urethra.  At this point, a Sensor guidewire was passed per urethra and this did pass into the bladder without difficulty under fluoroscopy.  At this point, a 24-French 15 cm balloon dilation catheter was passed over the wire until the x-ray marker could be seen in the area of the prostate.  The  balloon was then dilated to 18 atmospheres and held for approximately a minute before being released.  The balloon was then brought out more distally and reinflated to ensure that the entire urethra been adequately dilated.  At this point, the balloon was removed and the 22-French cystoscope with a 12-degree lens was passed alongside the wire.  Inspection revealed disruption of his panurethral stricture disease.  There was at most only mild sparing in the very proximal bulb.  The external sphincter was intact.  The prostatic urethra short without significant obstruction. Examination of the bladder revealed mild trabeculation with some slight erythema of the bladder wall, but no tumors or stones were noted.  Once the scope was introduced before significant fluid was instilled, the bladder had been drained and his capacity was about 900 mL  Specimen was collected for culture.  Once the bladder was inspected, some fluid was left in the bladder and the scope was removed.  It was actually rather difficult to remove because the tightness of the residual stricture.  An 18-French Council catheter was then placed over the wire into the bladder with some difficulty, but successfully.  Once the catheter was in the bladder, the balloon was filled with 10 mL  sterile fluid and the catheter was seated, the wire was removed with return of clear drainage.  At this point, the patient was taken down from lithotomy position.  His anesthetic was reversed.  He was moved to recovery room in stable condition.  There were no complications.     Excell Seltzer. Annabell Howells, M.D.     JJW/MEDQ  D:  05/20/2013  T:  05/21/2013  Job:  [id number]

## 2013-05-22 LAB — URINE CULTURE
Colony Count: NO GROWTH
Culture: NO GROWTH

## 2013-09-17 ENCOUNTER — Ambulatory Visit (HOSPITAL_COMMUNITY)
Admission: RE | Admit: 2013-09-17 | Discharge: 2013-09-17 | Disposition: A | Discharge status: Home / Self Care | Payer: Federal, State, Local not specified - PPO | Source: Ambulatory Visit | Attending: Urology | Admitting: Urology

## 2013-09-17 ENCOUNTER — Ambulatory Visit (HOSPITAL_COMMUNITY): Payer: Federal, State, Local not specified - PPO | Admitting: Anesthesiology

## 2013-09-17 ENCOUNTER — Encounter (HOSPITAL_COMMUNITY): Payer: Federal, State, Local not specified - PPO | Admitting: Anesthesiology

## 2013-09-17 ENCOUNTER — Ambulatory Visit (HOSPITAL_COMMUNITY): Payer: Federal, State, Local not specified - PPO

## 2013-09-17 ENCOUNTER — Encounter (HOSPITAL_COMMUNITY)
Admission: RE | Disposition: A | Discharge status: Home / Self Care | Payer: Self-pay | Source: Ambulatory Visit | Attending: Urology

## 2013-09-17 ENCOUNTER — Encounter (HOSPITAL_COMMUNITY): Payer: Self-pay | Admitting: *Deleted

## 2013-09-17 ENCOUNTER — Other Ambulatory Visit: Payer: Self-pay | Admitting: Urology

## 2013-09-17 DIAGNOSIS — I10 Essential (primary) hypertension: Secondary | ICD-10-CM | POA: Insufficient documentation

## 2013-09-17 DIAGNOSIS — R338 Other retention of urine: Secondary | ICD-10-CM | POA: Insufficient documentation

## 2013-09-17 DIAGNOSIS — N35919 Unspecified urethral stricture, male, unspecified site: Secondary | ICD-10-CM | POA: Insufficient documentation

## 2013-09-17 HISTORY — PX: CYSTOSCOPY WITH URETHRAL DILATATION: SHX5125

## 2013-09-17 LAB — BASIC METABOLIC PANEL
BUN: 14 mg/dL (ref 6–23)
CO2: 25 mEq/L (ref 19–32)
Calcium: 9 mg/dL (ref 8.4–10.5)
Creatinine, Ser: 1.01 mg/dL (ref 0.50–1.35)
GFR calc Af Amer: >90 mL/min (ref >90)

## 2013-09-17 IMAGING — CR DG CHEST 2V
2 series · 2 of 2 positions shown · non-contrast
Comparison: 07/04/2010

CLINICAL DATA: Preoperative examination (cysto), history of
hypertension

EXAM:
CHEST  2 VIEW

[w chest pa]
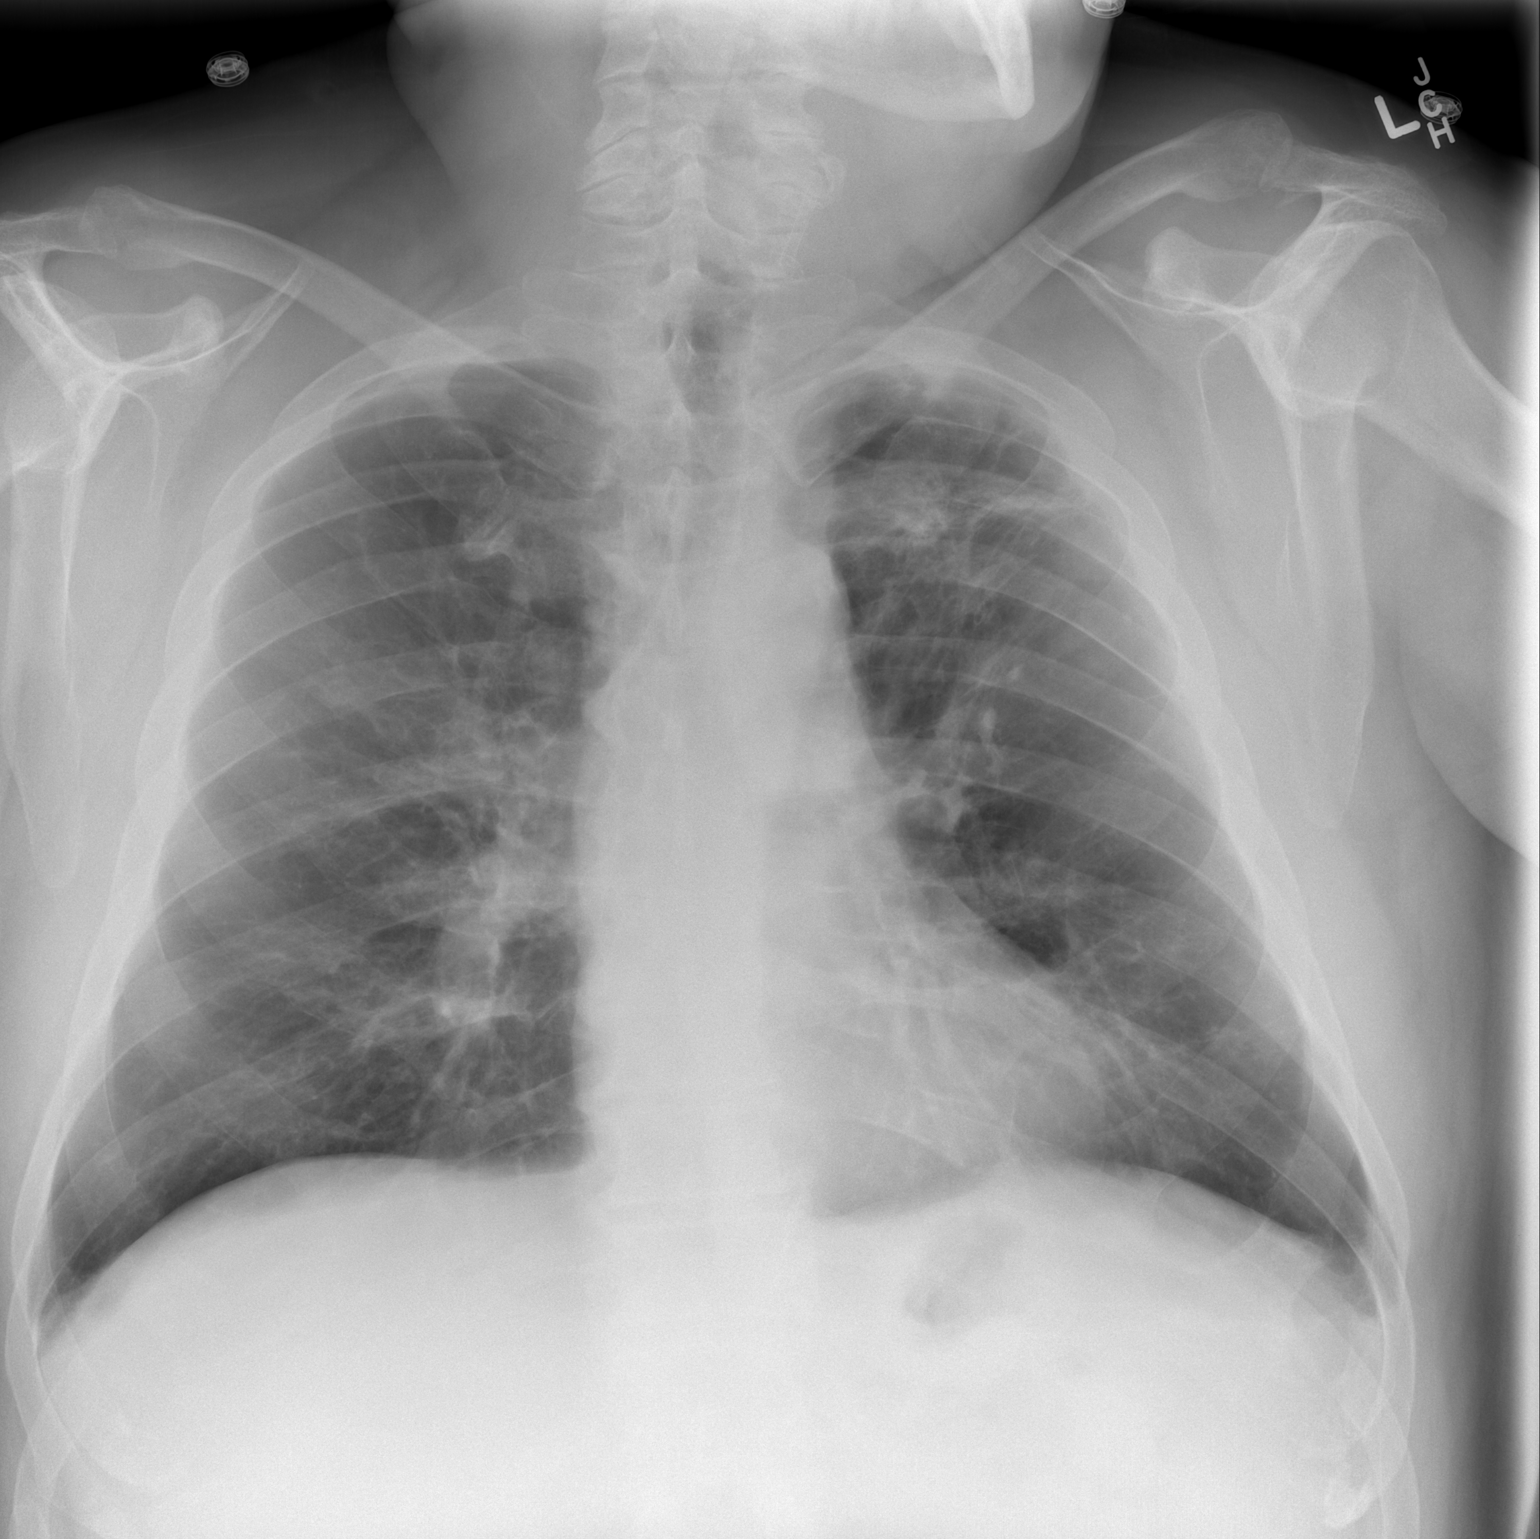

[w chest lat]
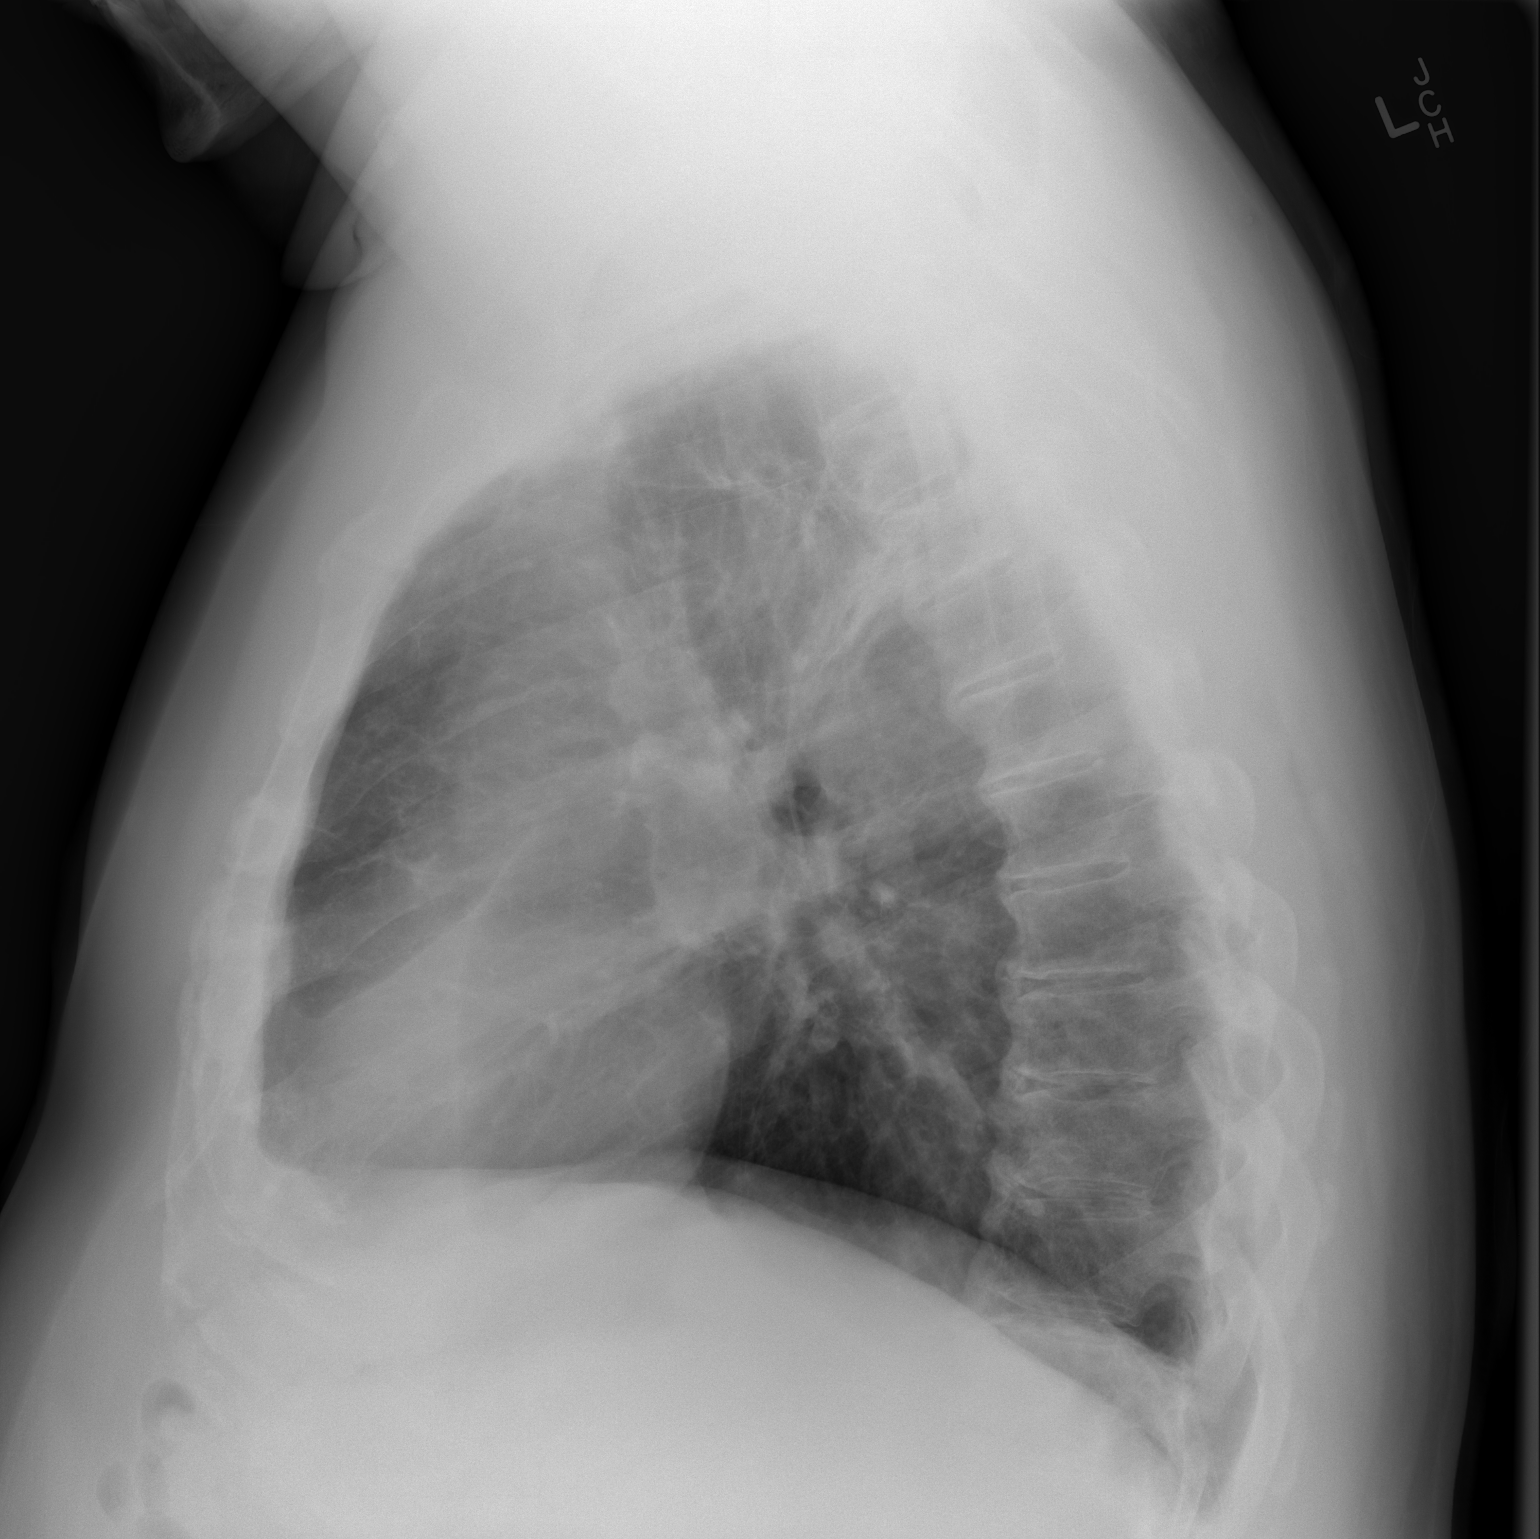

[2 of 2 positions shown; findings below may reference images not displayed]

FINDINGS: Grossly unchanged cardiac silhouette and mediastinal contours. The
lungs appear mildly hyperexpanded with flattening of the bilateral
hemidiaphragms and mild diffuse thinning of the pulmonary
parenchyma. There is grossly unchanged heterogeneous opacities and
architectural distortion within the left upper and lower lung,
stable since the 6477 examination. There is a possible 9 mm opacity
overlying the right. No focal airspace opacities no pleural effusion
or pneumothorax. No evidence of edema. Unchanged bones.
IMPRESSION: 1. Hyperexpanded lungs without acute cardiopulmonary disease.
2. Indeterminate approximately 9 mm nodule overlies the right upper
lung. Wall possibly artifactual due to the confluence of overlying
osseous and vascular structures, a discrete underlying nodule is not
excluded. Further evaluation with chest CT is recommended
3. Parenchymal scar within the left lung, stable since the 6477
examination.
These results will be called to the ordering clinician or
representative by the Radiologist Assistant, and communication
documented in the PACS Dashboard.

## 2013-09-17 SURGERY — CYSTOSCOPY, WITH URETHRAL DILATION
Anesthesia: General | Wound class: Clean Contaminated

## 2013-09-17 MED ORDER — FENTANYL CITRATE 0.05 MG/ML IJ SOLN
INTRAMUSCULAR | Status: DC | PRN
Start: 2013-09-17 — End: 2013-09-17
  Administered 2013-09-17 (×4): 50 ug via INTRAVENOUS

## 2013-09-17 MED ORDER — IOHEXOL 300 MG/ML  SOLN
INTRAMUSCULAR | Status: DC | PRN
  Administered 2013-09-17: 60 mL

## 2013-09-17 MED ORDER — PROMETHAZINE HCL 25 MG/ML IJ SOLN: 6.2500 mg | INTRAMUSCULAR | Status: DC | PRN

## 2013-09-17 MED ORDER — FENTANYL CITRATE 0.05 MG/ML IJ SOLN
INTRAMUSCULAR | Status: AC
  Filled 2013-09-17: qty 2

## 2013-09-17 MED ORDER — MIDAZOLAM HCL 5 MG/5ML IJ SOLN
INTRAMUSCULAR | Status: DC | PRN
  Administered 2013-09-17: 0.5 mg via INTRAVENOUS
  Administered 2013-09-17: 1 mg via INTRAVENOUS

## 2013-09-17 MED ORDER — PROPOFOL 10 MG/ML IV BOLUS
INTRAVENOUS | Status: DC | PRN
  Administered 2013-09-17: 200 mg via INTRAVENOUS

## 2013-09-17 MED ORDER — TRAMADOL HCL 50 MG PO TABS: 50.0000 mg | ORAL_TABLET | Freq: Four times a day (QID) | ORAL | Status: DC | PRN

## 2013-09-17 MED ORDER — SODIUM CHLORIDE 0.9 % IR SOLN
Status: DC | PRN
  Administered 2013-09-17: 2000 mL via INTRAVESICAL

## 2013-09-17 MED ORDER — SENNOSIDES-DOCUSATE SODIUM 8.6-50 MG PO TABS: 1.0000 | ORAL_TABLET | Freq: Two times a day (BID) | ORAL | Status: DC

## 2013-09-17 MED ORDER — CEPHALEXIN 250 MG PO CAPS: 250.0000 mg | ORAL_CAPSULE | Freq: Two times a day (BID) | ORAL | Status: DC

## 2013-09-17 MED ORDER — ONDANSETRON HCL 4 MG/2ML IJ SOLN
INTRAMUSCULAR | Status: DC | PRN
  Administered 2013-09-17: 4 mg via INTRAVENOUS

## 2013-09-17 MED ORDER — LIDOCAINE HCL (CARDIAC) 20 MG/ML IV SOLN
INTRAVENOUS | Status: DC | PRN
  Administered 2013-09-17: 75 mg via INTRAVENOUS

## 2013-09-17 MED ORDER — FENTANYL CITRATE 0.05 MG/ML IJ SOLN
25.0000 ug | INTRAMUSCULAR | Status: DC | PRN
  Administered 2013-09-17: 50 ug via INTRAVENOUS

## 2013-09-17 MED ORDER — DEXTROSE 5 % IV SOLN
1.0000 g | Freq: Once | INTRAVENOUS | Status: AC
  Administered 2013-09-17: 1 g via INTRAVENOUS
  Filled 2013-09-17: qty 10

## 2013-09-17 MED ORDER — KETOROLAC TROMETHAMINE 30 MG/ML IJ SOLN: 15.0000 mg | Freq: Once | INTRAMUSCULAR | Status: DC | PRN

## 2013-09-17 MED ORDER — LACTATED RINGERS IV SOLN
INTRAVENOUS | Status: DC | PRN
  Administered 2013-09-17: 19:00:00 via INTRAVENOUS

## 2013-09-17 MED ORDER — TRAMADOL HCL 50 MG PO TABS
50.0000 mg | ORAL_TABLET | Freq: Once | ORAL | Status: AC
  Administered 2013-09-17: 50 mg via ORAL
  Filled 2013-09-17: qty 1

## 2013-09-17 SURGICAL SUPPLY — 25 items
BAG URINE DRAINAGE (UROLOGICAL SUPPLIES) ×2 IMPLANT
BALLN NEPHROSTOMY (BALLOONS) ×2
BALLOON NEPHROSTOMY (BALLOONS) ×1 IMPLANT
BASKET LASER NITINOL 1.9FR (BASKET) IMPLANT
BASKET STNLS GEMINI 4WIRE 3FR (BASKET) IMPLANT
BASKET ZERO TIP NITINOL 2.4FR (BASKET) IMPLANT
CATH FOLEY 2W COUNCIL 20FR 5CC (CATHETERS) ×2 IMPLANT
CATH FOLEY 2WAY  3CC  8FR (CATHETERS) ×1
CATH FOLEY 2WAY 3CC 8FR (CATHETERS) ×1 IMPLANT
CATH INTERMIT  6FR 70CM (CATHETERS) ×2 IMPLANT
CATH ROBINSON RED A/P 10FR (CATHETERS) ×2 IMPLANT
DRAPE CAMERA CLOSED 9X96 (DRAPES) ×2 IMPLANT
ELECT REM PT RETURN 9FT ADLT (ELECTROSURGICAL)
ELECTRODE REM PT RTRN 9FT ADLT (ELECTROSURGICAL) IMPLANT
FIBER LASER FLEXIVA 200 (UROLOGICAL SUPPLIES) IMPLANT
FIBER LASER FLEXIVA 365 (UROLOGICAL SUPPLIES) IMPLANT
GLOVE BIOGEL M STRL SZ7.5 (GLOVE) ×2 IMPLANT
GOWN PREVENTION PLUS LG XLONG (DISPOSABLE) ×2 IMPLANT
GUIDEWIRE ANG ZIPWIRE 038X150 (WIRE) IMPLANT
GUIDEWIRE STR DUAL SENSOR (WIRE) ×2 IMPLANT
IV NS IRRIG 3000ML ARTHROMATIC (IV SOLUTION) IMPLANT
PACK CYSTO (CUSTOM PROCEDURE TRAY) ×2 IMPLANT
SYRINGE 10CC LL (SYRINGE) IMPLANT
SYRINGE IRR TOOMEY STRL 70CC (SYRINGE) IMPLANT
TUBE FEEDING 8FR 16IN STR KANG (MISCELLANEOUS) ×2 IMPLANT

## 2013-09-17 NOTE — H&P (Signed)
Derek Dean is an 52 y.o. male.    Chief Complaint: Pre-OP Cysto, Urethral Dilation  HPI:    1 - Urethral Stricture / BXO - long h/o pan-urethral stricture managed with prn dilation most recently by Dr. Annabell Howells. Was getting dialate about Q24 mos but now down to at least yearly or more frequently. Last dilated in OR 05/2013 at which time pan-urethral involvment noted.  Today Derek Dean is seen to proceed with urgent cysto / urethral dilation. No fevers. UA today with pyuria.  Past Medical History  Diagnosis Date  . Kidney calculi   . Hypertension   . Urethral stricture     Past Surgical History  Procedure Laterality Date  . Lithotripsy  1 year ago    stretching of urethra  . Appendectomy  38 years ago  . Balloon dilation  08/06/2012    Procedure: BALLOON DILATION;  Surgeon: Anner Crete, MD;  Location: [facility];  Service: Urology;  Laterality: N/A;  . Cystoscopy with urethral dilatation N/A 05/20/2013    Procedure: CYSTOSCOPY WITH URETHRAL DILATATION;  Surgeon: Anner Crete, MD;  Location: [facility];  Service: Urology;  Laterality: N/A;    No family history on file. Social History:  reports that he has never smoked. He does not have any smokeless tobacco history on file. He reports that he does not drink alcohol or use illicit drugs.  Allergies:  Allergies  Allergen Reactions  . Codeine Anaphylaxis  . Erythromycin     Swell lips and throat  . Sulfa Antibiotics Anaphylaxis    No prescriptions prior to admission    No results found for this or any previous visit (from the past 48 hour(s)). No results found.  Review of Systems  Constitutional: Positive for malaise/fatigue. Negative for fever and chills.  HENT: Negative.   Eyes: Negative.   Respiratory: Negative.   Cardiovascular: Negative.   Gastrointestinal: Negative.   Genitourinary: Positive for dysuria, urgency and frequency.  Musculoskeletal: Negative.   Skin: Negative.   Neurological: Negative.    Endo/Heme/Allergies: Negative.   Psychiatric/Behavioral: Negative.     There were no vitals taken for this visit. Physical Exam  Constitutional: He is oriented to person, place, and time. He appears well-developed and well-nourished.  HENT:  Head: Normocephalic and atraumatic.  Eyes: EOM are normal. Pupils are equal, round, and reactive to light.  Neck: Normal range of motion. Neck supple.  Cardiovascular: Normal rate.   Respiratory: Effort normal.  GI: Soft. Bowel sounds are normal.  Moderate truncal obesity  Genitourinary:  Severe glans changes c/w BXO. Palpable urethral stricture.  Musculoskeletal: Normal range of motion.  Neurological: He is alert and oriented to person, place, and time.  Skin: Skin is warm and dry.  Psychiatric: He has a normal mood and affect. His behavior is normal. Judgment and thought content normal.     Assessment/Plan  1 - Urethral Stricture / BXO - In retention now. Did not tollerate attempt office dilation. GAve option of SPT v. urethral dilation today and he wants latter. NPO since 6am. Will add on as outpatient today. He understands he will have catheter afterwards for several days. Risks including bleeding, infection, non-cure, damage to urethra, conversion to SP Tube discussed.   I Explained frankly to pt he needs urethral reconstruction as only option to avoid long-term permanent bladder and kidney damage other than SPT or other urinary diversion. Discussed tertiary referral and he is amenable.   Derek Dean 09/17/2013, 3:03 PM

## 2013-09-17 NOTE — Transfer of Care (Signed)
Immediate Anesthesia Transfer of Care Note  Patient: Derek Dean  Procedure(s) Performed: Procedure(s): CYSTOSCOPY WITH URETHRAL DILATATION AND RETROGRADE URETHROGRAM (N/A)  Patient Location: PACU  Anesthesia Type:General  Level of Consciousness: awake, alert  and oriented  Airway & Oxygen Therapy: Patient Spontanous Breathing and Patient connected to face mask oxygen  Post-op Assessment: Report given to PACU RN and Post -op Vital signs reviewed and stable  Post vital signs: Reviewed and stable  Complications: No apparent anesthesia complications

## 2013-09-17 NOTE — Anesthesia Procedure Notes (Signed)
Procedure Name: LMA Insertion Date/Time: 09/17/2013 7:33 PM Performed by: Edison Pace Pre-anesthesia Checklist: Emergency Drugs available, Patient identified, Timeout performed, Suction available and Patient being monitored Patient Re-evaluated:Patient Re-evaluated prior to inductionOxygen Delivery Method: Circle system utilized Preoxygenation: Pre-oxygenation with 100% oxygen Intubation Type: IV induction LMA: LMA with gastric port inserted LMA Size: 5.0 Number of attempts: 2 Tube secured with: Tape Dental Injury: Teeth and Oropharynx as per pre-operative assessment

## 2013-09-17 NOTE — Anesthesia Postprocedure Evaluation (Signed)
  Anesthesia Post-op Note  Patient: Derek Dean  Procedure(s) Performed: Procedure(s) (LRB): CYSTOSCOPY WITH URETHRAL DILATATION AND RETROGRADE URETHROGRAM (N/A)  Patient Location: PACU  Anesthesia Type: General  Level of Consciousness: awake and alert   Airway and Oxygen Therapy: Patient Spontanous Breathing  Post-op Pain: mild  Post-op Assessment: Post-op Vital signs reviewed, Patient's Cardiovascular Status Stable, Respiratory Function Stable, Patent Airway and No signs of Nausea or vomiting  Last Vitals:  Filed Vitals:   09/17/13 2030  BP: 167/100  Pulse: 63  Temp:   Resp:     Post-op Vital Signs: stable   Complications: No apparent anesthesia complications

## 2013-09-17 NOTE — Anesthesia Preprocedure Evaluation (Signed)
Anesthesia Evaluation  Patient identified by MRN, date of birth, ID band Patient awake    Reviewed: Allergy & Precautions, H&P , NPO status , Patient's Chart, lab work & pertinent test results  Airway Mallampati: II TM Distance: <3 FB Neck ROM: Full    Dental no notable dental hx.    Pulmonary neg pulmonary ROS,  breath sounds clear to auscultation  Pulmonary exam normal       Cardiovascular hypertension, Pt. on medications Rhythm:Regular Rate:Normal     Neuro/Psych negative neurological ROS  negative psych ROS   GI/Hepatic negative GI ROS, Neg liver ROS,   Endo/Other  Morbid obesity  Renal/GU negative Renal ROS  negative genitourinary   Musculoskeletal negative musculoskeletal ROS (+)   Abdominal   Peds negative pediatric ROS (+)  Hematology negative hematology ROS (+)   Anesthesia Other Findings   Reproductive/Obstetrics negative OB ROS                           Anesthesia Physical Anesthesia Plan  ASA: III  Anesthesia Plan: General   Post-op Pain Management:    Induction: Intravenous  Airway Management Planned: LMA  Additional Equipment:   Intra-op Plan:   Post-operative Plan:   Informed Consent: I have reviewed the patients History and Physical, chart, labs and discussed the procedure including the risks, benefits and alternatives for the proposed anesthesia with the patient or authorized representative who has indicated his/her understanding and acceptance.   Dental advisory given  Plan Discussed with: CRNA and Surgeon  Anesthesia Plan Comments:         Anesthesia Quick Evaluation

## 2013-09-17 NOTE — Brief Op Note (Signed)
09/17/2013  8:16 PM  PATIENT:  Piedad Climes Gao  52 y.o. male  PRE-OPERATIVE DIAGNOSIS:  urethral stricture and retention  POST-OPERATIVE DIAGNOSIS:  urethral stricture and retention  PROCEDURE:  Procedure(s): CYSTOSCOPY WITH URETHRAL DILATATION AND RETROGRADE URETHROGRAM (N/A)  SURGEON:  Surgeon(s) and Role:    * Sebastian Ache, MD - Primary  PHYSICIAN ASSISTANT:   ASSISTANTS: none   ANESTHESIA:   general  EBL:     BLOOD ADMINISTERED:none  DRAINS: 25F council foley to straight drain   LOCAL MEDICATIONS USED:  NONE  SPECIMEN:  No Specimen  DISPOSITION OF SPECIMEN:  N/A  COUNTS:  YES  TOURNIQUET:  * No tourniquets in log *  DICTATION: .Other Dictation: Dictation Number [id number]  PLAN OF CARE: Discharge to home after PACU  PATIENT DISPOSITION:  PACU - hemodynamically stable.   Delay start of Pharmacological VTE agent (>24hrs) due to surgical blood loss or risk of bleeding: yes

## 2013-09-18 ENCOUNTER — Encounter (HOSPITAL_COMMUNITY): Payer: Self-pay | Admitting: Urology

## 2013-09-20 NOTE — Op Note (Signed)
Derek Derek Dean, Derek Dean NO.:  [id number]  MEDICAL RECORD NO.:  [id number]  LOCATION:  [facility]                         FACILITY:  [facility]  PHYSICIAN:  Sebastian Ache, MD     DATE OF BIRTH:  [birth year]  DATE OF PROCEDURE: 09/17/2013 DATE OF DISCHARGE:  09/17/2013                              OPERATIVE REPORT   PREOPERATIVE DIAGNOSES:  Urinary retention, history of dense urethral stricture, and balanitis xerotica obliterans.  PROCEDURE: 1. Retrograde urethrogram with interpretation. 2. Balloon dilation of urethral stricture. 3. Cystourethroscopy with catheter placement, complicated.  DRAIN:  22-French Council catheter, straight drain.  FINDINGS: 1. Panurethral stricture for meatus to prostatic urethra, 8-French or     less predilation, and 22-French post dilation. 2. Trabeculated urinary bladder with cloudy urine.  ESTIMATED BLOOD LOSS:  Nil.  COMPLICATIONS:  None.  SPECIMEN:  None.  INDICATIONS:  Derek Derek Dean Derek Dean is a 52 year old gentleman with history of recurrent urethral stricture disease.  He is dependent upon urethral dilation, which has been increasing in frequency over the past several years.  He has failed to keep up with followup on multiple occasions and usually presents when he is in frank retention.  He presented as such today to the office, was found to be in frank urinary retention and did not tolerate attempted office dilation.  Options were discussed including SP Tube versus urethral dilation and he wished to proceed with the latter.  We also discussed at length, long-term options including tertiary referral for consideration of graft urethroplasty and such referral has been made.  Informed consent was obtained and placed in medical record.  PROCEDURE IN DETAIL:  The patient being Derek Derek Dean Derek Dean, procedure being cystourethroscopy, dilation of urethral stricture confirmed.  Procedure was carried out.  Time-out was performed.  Intravenous  antibiotics administered.  General LMA anesthesia was introduced.  The patient placed into a low lithotomy position.  Sterile field was created by prepping and draping the patient's penis, perineum, and proximal thighs using iodine x3.  Next, retrograde urethrogram was performed using a guidewire, guide and non-dilute contrast.  Retrograde urethrogram revealed multifocal and nodular panurethral stricture disease, relatively normal-appearing contour of the prostatic urethra and urinary bladder.  A 0.038 guidewire was advanced into the urinary bladder Over which an 8-French catheter was placed allowing instillation of contrast in urinary bladder to verify placement and anatomy.  A NephroMax balloon dilation apparatus was carefully positioned across this, proximally across through the bladder neck, inflated to pressure of 18 atmospheres and held for 90 seconds, then taken back to Cover the remainder distal portion of the urethra and inflated for 18 atmospheres for 90 seconds and then removed.  Next, cystourethroscopy was performed using a 16-French flexible cystoscope which revealed excellent urethral patency, but multinodular focal dense stricture.  Prostatic urethra was widely patent.  Urinary bladder was trabeculated and cloudy urine.  Ureteral orifices were in normal anatomic position.  Next, a new 20-French Council catheter was placed, the remaining guidewire to the level of the urinary bladder. Fluoroscopic guidance confirmed placement 10 mL sterile water in the balloon, And connected to  straight drain.  Procedure was then terminated.  The patient tolerated the procedure well, there  were no immediate periprocedural complications.  The patient was taken to postanesthesia care unit in stable condition.          ______________________________ Sebastian Ache, MD     TM/MEDQ  D:  09/17/2013  T:  09/18/2013  Job:  [id number]

## 2013-10-24 ENCOUNTER — Institutional Professional Consult (permissible substitution): Payer: Federal, State, Local not specified - PPO | Admitting: Pulmonary Disease

## 2013-10-25 ENCOUNTER — Ambulatory Visit (INDEPENDENT_AMBULATORY_CARE_PROVIDER_SITE_OTHER): Payer: Federal, State, Local not specified - PPO | Admitting: Pulmonary Disease

## 2013-10-25 ENCOUNTER — Encounter: Payer: Self-pay | Admitting: Pulmonary Disease

## 2013-10-25 VITALS — BP 146/96 | HR 51 | Temp 97.6°F | Ht 68.5 in | Wt 283.0 lb

## 2013-10-25 DIAGNOSIS — R911 Solitary pulmonary nodule: Secondary | ICD-10-CM

## 2013-10-25 NOTE — Assessment & Plan Note (Signed)
He has some scarring in his left lung which is chronic and likely due to an old episode of pneumonia.    The nodule is small, round, and solid which are all good features.  I would like for him to have a CT to better characterize this nodule.  He is not a smoker and based on these characteristics this would be a low risk lesion in terms of malignancy.  A CT will help [place] define this better.  Plan: -CT chest without contrast to evaluate the pulmonary nodule -will need 2 years of surveillance, frequency will depend on the results of the CT

## 2013-10-25 NOTE — Patient Instructions (Signed)
We will order a CT scan of your chest to evaluate the pulmonary nodule We will see you back in 6 months or sooner if needed

## 2013-10-25 NOTE — Progress Notes (Signed)
Subjective:    Patient ID: Derek Dean, male    DOB: 1961-10-04, 52 y.o.   MRN: [id number]  HPI  Mr. Derek Dean had a CXR recently which showed a nodule in his lung.  He had a urethral stricture dilation performed and a CXR was performed at that time.  He doesn't recall feeling ill at the time of the procedure.  He has never had any respiratory problems.  He   He notes mild dyspnea with exertion which he attrubtes to obesity.  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  He has been much more obese in the past and has managed to decrease his weight by about 40 lbs.  He had asthma as a child, and had a bad episode of pneumonia about 9 years ago which led to him being out of work for about 30 days.  He has used nasal cocaine many years ago but he hasn't smoked.  His wife has smoked 2 packs per day for many years.    He has not had hemoptysis, chest pain, or cough.    Past Medical History  Diagnosis Date  . Kidney calculi   . Hypertension   . Urethral stricture      No family history on file.   History   Social History  . Marital Status: Married    Spouse Name: N/A    Number of Children: N/A  . Years of Education: N/A   Occupational History  . Not on file.   Social History Main Topics  . Smoking status: Passive Smoke Exposure - Never Smoker -- 53 years  . Smokeless tobacco: Never Used  . Alcohol Use: Yes  . Drug Use: No  . Sexual Activity: No   Other Topics Concern  . Not on file   Social History Narrative  . No narrative on file     Allergies  Allergen Reactions  . Codeine Anaphylaxis  . Erythromycin     Swell lips and throat  . Sulfa Antibiotics Anaphylaxis     Outpatient Prescriptions Prior to Visit  Medication Sig Dispense Refill  . lisinopril (PRINIVIL,ZESTRIL) 10 MG tablet Take 10 mg by mouth daily.      . sildenafil (VIAGRA) 100 MG tablet Take 100 mg by mouth as needed for erectile dysfunction.      Marland Kitchen zolpidem (AMBIEN) 10 MG tablet Take  10 mg by mouth at bedtime.       . cephALEXin (KEFLEX) 250 MG capsule Take 1 capsule (250 mg total) by mouth 2 (two) times daily. X 7 days to prevent infection  14 capsule  0  . senna-docusate (SENOKOT-S) 8.6-50 MG per tablet Take 1 tablet by mouth 2 (two) times daily. While taking pain meds to prevent constipation.  20 tablet  0  . traMADol (ULTRAM) 50 MG tablet Take 1 tablet (50 mg total) by mouth every 6 (six) hours as needed for moderate pain. Post-operatively.  30 tablet  0   No facility-administered medications prior to visit.      Review of Systems  Constitutional: Negative for fever and unexpected weight change.  HENT: Positive for dental problem. Negative for congestion, ear pain, nosebleeds, postnasal drip, rhinorrhea, sinus pressure, sneezing, sore throat and trouble swallowing.   Eyes: Negative for redness and itching.  Respiratory: Positive for shortness of breath. Negative for cough, chest tightness and wheezing.   Cardiovascular: Negative for palpitations and leg swelling.  Gastrointestinal: Negative for nausea and vomiting.  Genitourinary: Negative for  dysuria.  Musculoskeletal: Positive for joint swelling.  Skin: Negative for rash.  Neurological: Negative for headaches.  Hematological: Does not bruise/bleed easily.  Psychiatric/Behavioral: Negative for dysphoric mood. The patient is not nervous/anxious.        Objective:   Physical Exam Filed Vitals:   10/25/13 1608  BP: 146/96  Pulse: 51  Temp: 97.6 F (36.4 C)  TempSrc: Oral  Height: 5' 8.5" (1.74 m)  Weight: 283 lb (128.368 kg)  SpO2: 98%  RA  Gen: chronically ill appearing, obese no acute distress HEENT: NCAT, PERRL, EOMi, OP clear, neck supple without masses; nasal septum perforation noed PULM: CTA B CV: RRR, no mgr, no JVD AB: BS+, soft, nontender, no hsm Ext: warm, no edema, no clubbing, no cyanosis Derm: no rash or skin breakdown Neuro: A&Ox4, CN II-XII intact, strength 5/5 in all 4  extremities    09/2013 CXR RUL 9mm pulmonary nodule     Assessment & Plan:   Solitary pulmonary nodule He has some scarring in his left lung which is chronic and likely due to an old episode of pneumonia.    The nodule is small, round, and solid which are all good features.  I would like for him to have a CT to better characterize this nodule.  He is not a smoker and based on these characteristics this would be a low risk lesion in terms of malignancy.  A CT will help [place] define this better.  Plan: -CT chest without contrast to evaluate the pulmonary nodule -will need 2 years of surveillance, frequency will depend on the results of the CT   Updated Medication List Outpatient Encounter Prescriptions as of 10/25/2013  Medication Sig  . lisinopril (PRINIVIL,ZESTRIL) 10 MG tablet Take 10 mg by mouth daily.  . sildenafil (VIAGRA) 100 MG tablet Take 100 mg by mouth as needed for erectile dysfunction.  Marland Kitchen zolpidem (AMBIEN) 10 MG tablet Take 10 mg by mouth at bedtime.   . [DISCONTINUED] cephALEXin (KEFLEX) 250 MG capsule Take 1 capsule (250 mg total) by mouth 2 (two) times daily. X 7 days to prevent infection  . [DISCONTINUED] senna-docusate (SENOKOT-S) 8.6-50 MG per tablet Take 1 tablet by mouth 2 (two) times daily. While taking pain meds to prevent constipation.  . [DISCONTINUED] traMADol (ULTRAM) 50 MG tablet Take 1 tablet (50 mg total) by mouth every 6 (six) hours as needed for moderate pain. Post-operatively.

## 2013-10-28 ENCOUNTER — Ambulatory Visit (INDEPENDENT_AMBULATORY_CARE_PROVIDER_SITE_OTHER)
Admission: RE | Admit: 2013-10-28 | Discharge: 2013-10-28 | Disposition: A | Discharge status: Home / Self Care | Payer: Federal, State, Local not specified - PPO | Source: Ambulatory Visit | Attending: Pulmonary Disease | Admitting: Pulmonary Disease

## 2013-10-28 DIAGNOSIS — R911 Solitary pulmonary nodule: Secondary | ICD-10-CM

## 2013-10-28 IMAGING — CT CT CHEST W/O CM
2 of 3 series · 15 of 36 positions shown, 18 images · IV contrast (Omnipaque 300)
Comparison: Chest radiograph, 09/17/2013

CLINICAL DATA: Short of breath. Abnormal chest radiograph showing
reticular opacities and a right lung nodule.

EXAM:
CT CHEST WITHOUT CONTRAST
TECHNIQUE: Multidetector CT imaging of the chest was performed following the
standard protocol without IV contrast.

[Series 2: chest routine with · axial · 0.80mm/px · z∈[-334,-64]mm · 12 of 64 slices shown, 15 images]
[im 5/64  mediastinal]
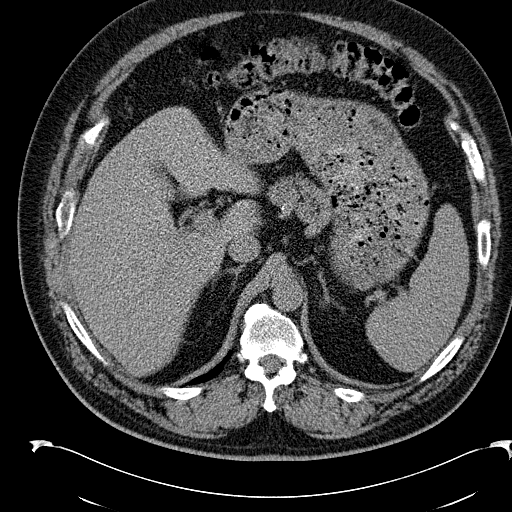
[im 5/64  lung]
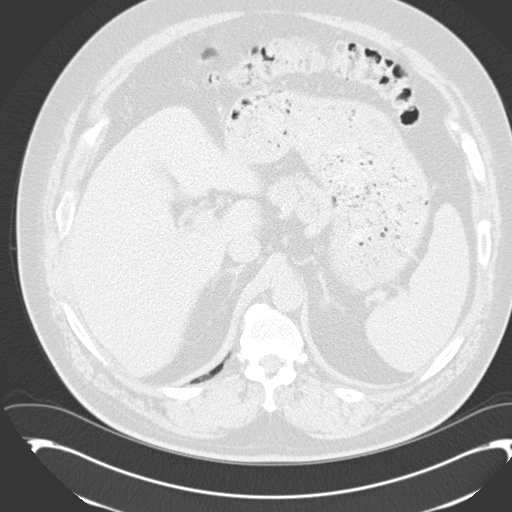
[im 10/64  lung]
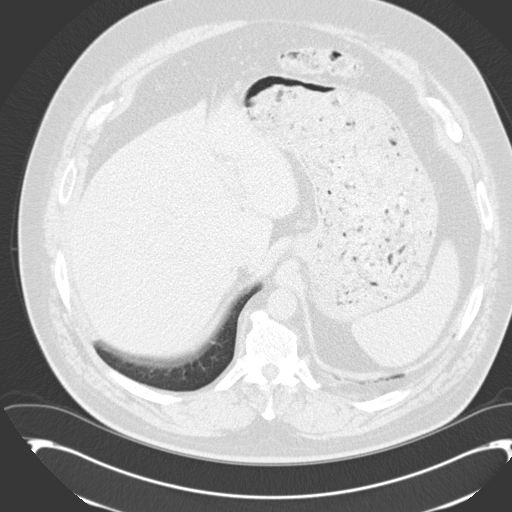
[im 15/64  lung]
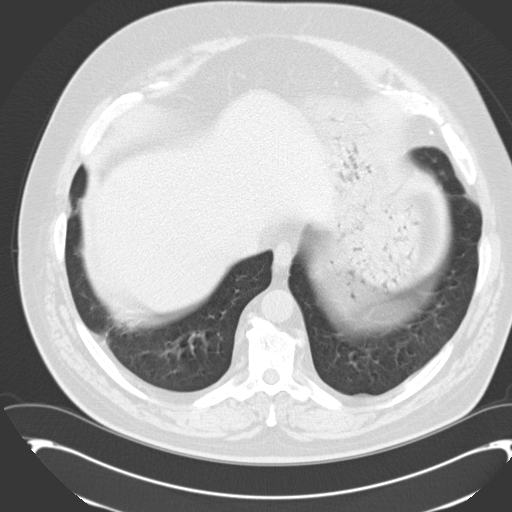
[im 19/64  lung]
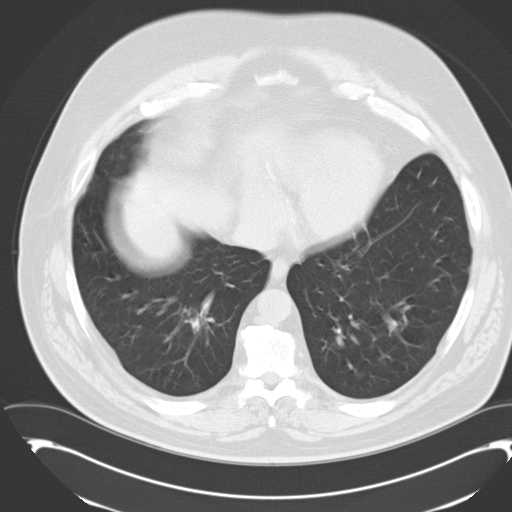
[im 24/64  mediastinal]
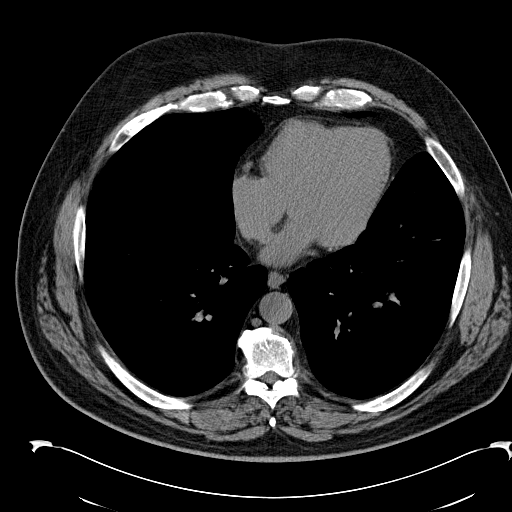
[im 24/64  lung]
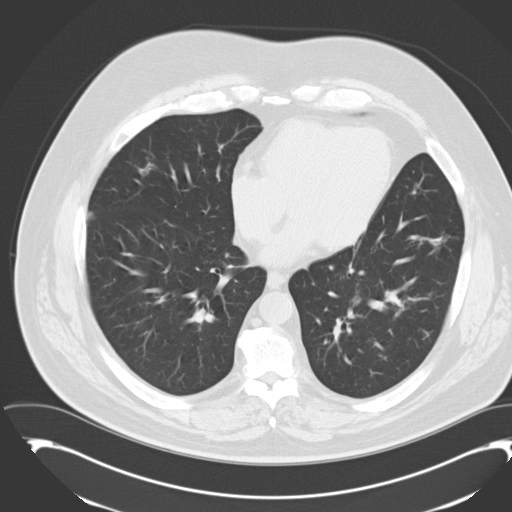
[im 29/64  lung]
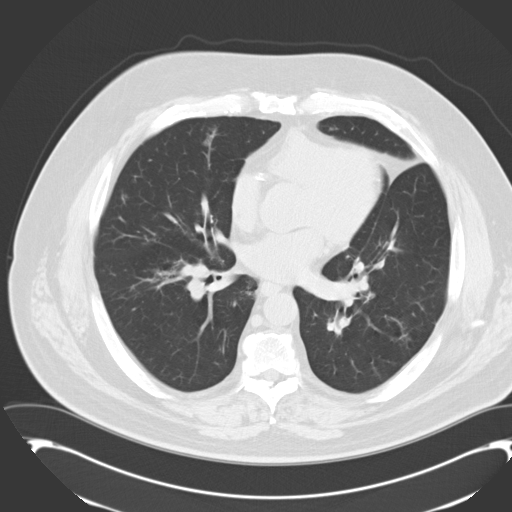
[im 36/64  lung]
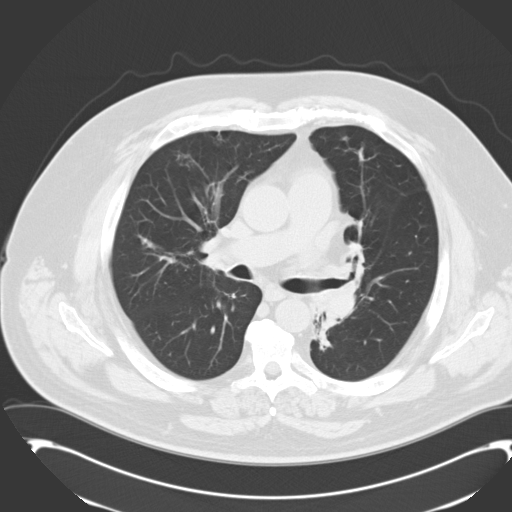
[im 40/64  lung]
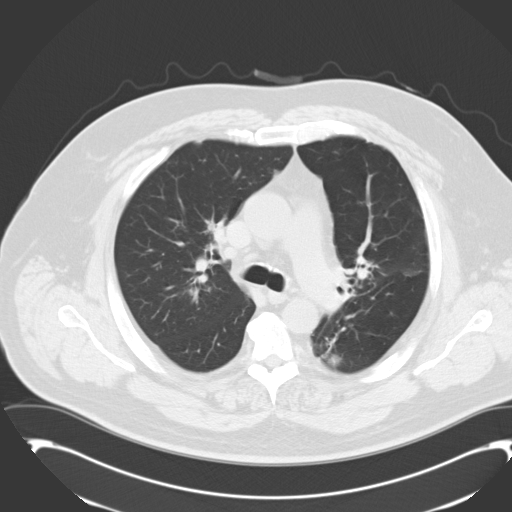
[im 45/64  mediastinal]
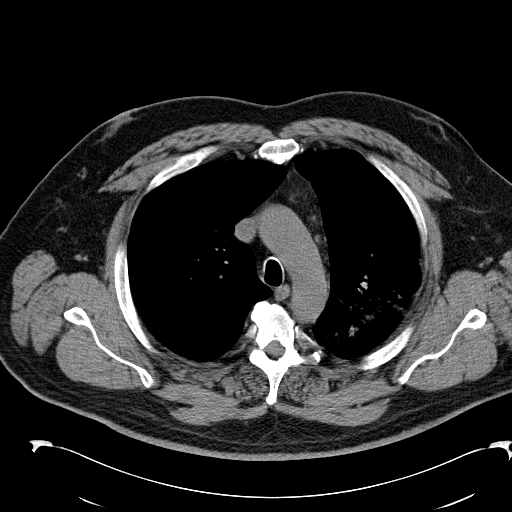
[im 45/64  lung]
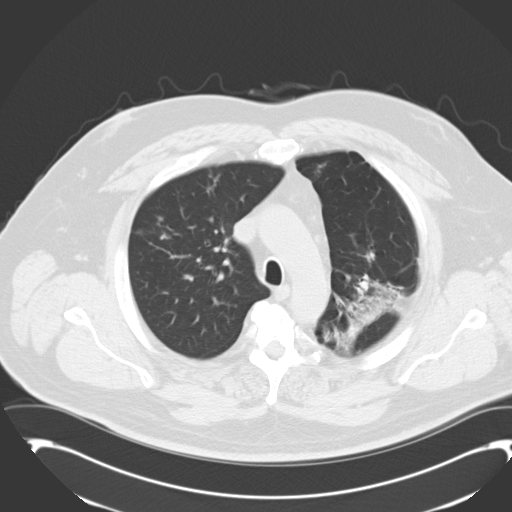
[im 50/64  lung]
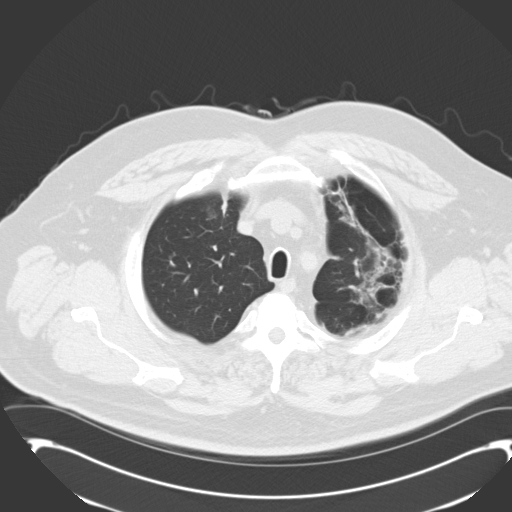
[im 54/64  lung]
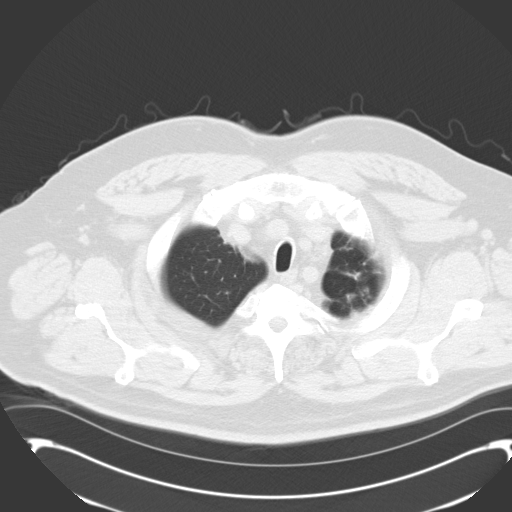
[im 59/64  lung]
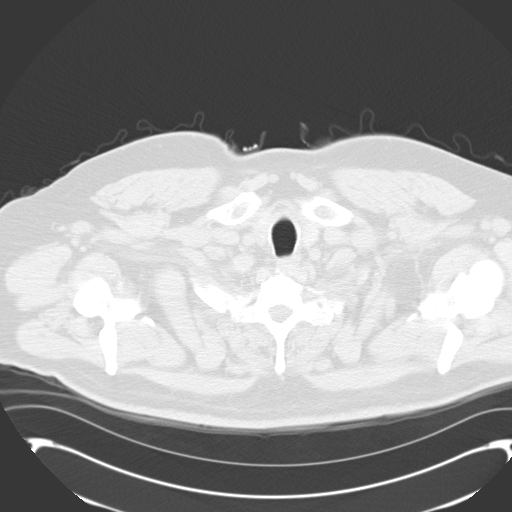

[Series 602: cor · coronal · 0.80mm/px · 3 of 145 slices shown]
[im 29/145  lung]
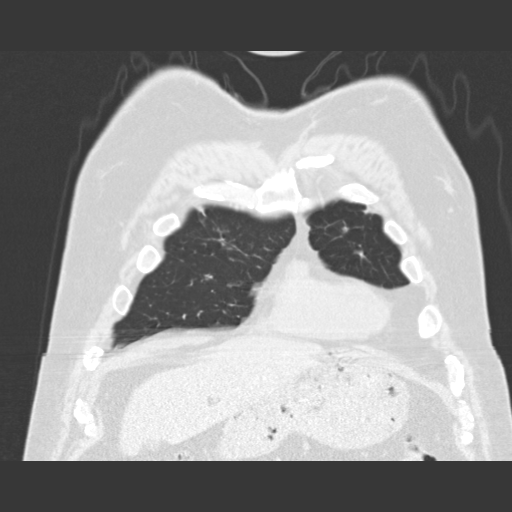
[im 58/145  lung]
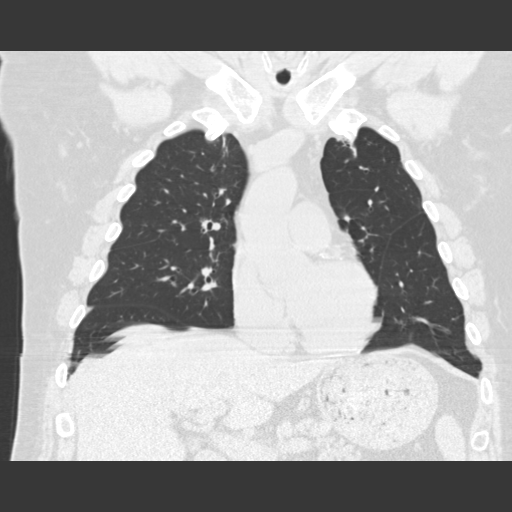
[im 87/145  lung]
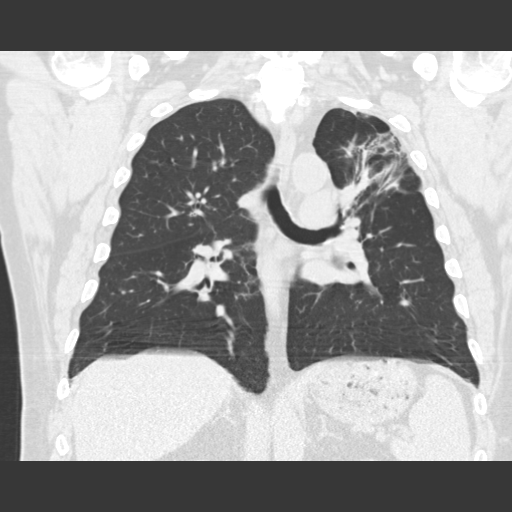

[15 of 36 positions shown; findings below may reference images not displayed]

FINDINGS: There is bilateral lung scarring, most prominently in the left upper
lobe posteriorly where it tents the superior oblique fissure. There
are reticular nodular opacities as well in a patchy distribution
bilaterally, mostly in the upper lobes. There is more confluent
central opacity. This is all likely chronic scarring.

6 mm nodule is noted laterally in the right lower lobe. No other
discrete pulmonary nodule is seen.

No pleural effusion.

Heart is normal in size. There are mild coronary artery
calcifications. The great vessels are normal in caliber. No
mediastinal or hilar masses or adenopathy.

Limited evaluation of the upper abdomen is unremarkable.

There are degenerative changes along the visualized spine. No
osteoblastic or osteolytic lesions.
IMPRESSION: 1. There is no discrete mass or pulmonary nodule to correspond to
the vague nodule noted on the chest radiograph. There are patchy
areas of coarse reticular and hazy lung opacity in the anterior
right upper lobe, which likely accounts for the chest radiographic
abnormality.
2. Multiple other areas of coarse reticular, reticular nodular and
patchy lung opacity most evident in the upper lobes, particularly
the posterior left upper lobe, most suggestive of scarring. No
convincing infiltrate.
3. 6 mm right lower lobe pulmonary nodule. If the patient is at high
risk for bronchogenic carcinoma, follow-up chest CT at 6-12 months
is recommended. If the patient is at low risk for bronchogenic
carcinoma, follow-up chest CT at 12 months is recommended. This
recommendation follows the consensus statement: Guidelines for
Management of Small Pulmonary Nodules Detected on CT Scans: A
Statement from the [HOSPITAL] as published in Radiology
2888;[DATE].
4. No mediastinal or hilar masses or adenopathy.
5. No acute findings.

## 2013-11-04 ENCOUNTER — Telehealth: Payer: Self-pay | Admitting: Pulmonary Disease

## 2013-11-04 ENCOUNTER — Encounter: Payer: Self-pay | Admitting: Pulmonary Disease

## 2013-11-04 DIAGNOSIS — R911 Solitary pulmonary nodule: Secondary | ICD-10-CM

## 2013-11-04 NOTE — Telephone Encounter (Signed)
I spoke with the pt and advised that  I will send a message to Dr. Kendrick Fries asking for results. Pt is requesting CT results from 10-28-13. Please advise. Carron Curie, CMA

## 2013-11-05 NOTE — Telephone Encounter (Signed)
LMTCB X1, pt needs appt in 6 mos with BQ per last ov.  He also needs to be made aware of ct results per BQ.

## 2013-11-05 NOTE — Telephone Encounter (Signed)
Message copied by Velvet Bathe on Tue Nov 05, 2013 11:41 AM ------      Message from: Max Fickle B      Created: Mon Nov 04, 2013  5:49 PM       A,            Please let him know that the nodule is smaller than we thought which is good.  He needs another CT chest in one year and a visit with me. Please order this and schedule a f/u appointment.            Thanks      b ------

## 2013-11-05 NOTE — Telephone Encounter (Signed)
Pt called stating that he wants to know the results of his CT and what to do next; BQ not back in office until 11/18/12 (?). Would like to know if doctor in office can read and advise.

## 2013-11-05 NOTE — Telephone Encounter (Signed)
Message copied by Velvet Bathe on Tue Nov 05, 2013 11:47 AM ------      Message from: Max Fickle B      Created: Mon Nov 04, 2013  5:49 PM       A,            Please let him know that the nodule is smaller than we thought which is good.  He needs another CT chest in one year and a visit with me. Please order this and schedule a f/u appointment.            Thanks      b ------

## 2013-11-05 NOTE — Telephone Encounter (Signed)
Pt made aware of results. Recall is in [facility] for f/u since schedule is not out. Nothing further needed

## 2014-02-10 ENCOUNTER — Emergency Department (HOSPITAL_COMMUNITY)
Admission: EM | Admit: 2014-02-10 | Discharge: 2014-02-10 | Disposition: A | Discharge status: Home / Self Care | Payer: Federal, State, Local not specified - PPO | Attending: Emergency Medicine | Admitting: Emergency Medicine

## 2014-02-10 ENCOUNTER — Emergency Department (HOSPITAL_COMMUNITY): Payer: Federal, State, Local not specified - PPO

## 2014-02-10 ENCOUNTER — Encounter (HOSPITAL_COMMUNITY): Payer: Self-pay | Admitting: Emergency Medicine

## 2014-02-10 DIAGNOSIS — Z79899 Other long term (current) drug therapy: Secondary | ICD-10-CM | POA: Insufficient documentation

## 2014-02-10 DIAGNOSIS — M549 Dorsalgia, unspecified: Secondary | ICD-10-CM

## 2014-02-10 DIAGNOSIS — N390 Urinary tract infection, site not specified: Secondary | ICD-10-CM | POA: Insufficient documentation

## 2014-02-10 DIAGNOSIS — Z87442 Personal history of urinary calculi: Secondary | ICD-10-CM | POA: Insufficient documentation

## 2014-02-10 DIAGNOSIS — N509 Disorder of male genital organs, unspecified: Secondary | ICD-10-CM | POA: Insufficient documentation

## 2014-02-10 DIAGNOSIS — R300 Dysuria: Secondary | ICD-10-CM | POA: Insufficient documentation

## 2014-02-10 DIAGNOSIS — Y9389 Activity, other specified: Secondary | ICD-10-CM | POA: Insufficient documentation

## 2014-02-10 DIAGNOSIS — G8918 Other acute postprocedural pain: Secondary | ICD-10-CM

## 2014-02-10 DIAGNOSIS — I10 Essential (primary) hypertension: Secondary | ICD-10-CM | POA: Insufficient documentation

## 2014-02-10 DIAGNOSIS — Z87448 Personal history of other diseases of urinary system: Secondary | ICD-10-CM | POA: Insufficient documentation

## 2014-02-10 DIAGNOSIS — I498 Other specified cardiac arrhythmias: Secondary | ICD-10-CM | POA: Insufficient documentation

## 2014-02-10 DIAGNOSIS — IMO0002 Reserved for concepts with insufficient information to code with codable children: Secondary | ICD-10-CM | POA: Insufficient documentation

## 2014-02-10 DIAGNOSIS — Y92410 Unspecified street and highway as the place of occurrence of the external cause: Secondary | ICD-10-CM | POA: Insufficient documentation

## 2014-02-10 LAB — URINALYSIS, ROUTINE W REFLEX MICROSCOPIC
Bilirubin Urine: NEGATIVE
Glucose, UA: NEGATIVE mg/dL
Ketones, ur: NEGATIVE mg/dL
Nitrite: POSITIVE — AB
Protein, ur: 100 mg/dL — AB
Specific Gravity, Urine: 1.018 (ref 1.005–1.030)
Urobilinogen, UA: 0.2 mg/dL (ref 0.0–1.0)
pH: 6.5 (ref 5.0–8.0)

## 2014-02-10 LAB — BASIC METABOLIC PANEL
BUN: 14 mg/dL (ref 6–23)
CHLORIDE: 104 meq/L (ref 96–112)
CO2: 22 meq/L (ref 19–32)
CREATININE: 0.93 mg/dL (ref 0.50–1.35)
Calcium: 8.9 mg/dL (ref 8.4–10.5)
GFR calc non Af Amer: >90 mL/min (ref >90)
Glucose, Bld: 122 mg/dL — ABNORMAL HIGH (ref 70–99)
POTASSIUM: 4.1 meq/L (ref 3.7–5.3)
Sodium: 141 mEq/L (ref 137–147)

## 2014-02-10 LAB — CBC WITH DIFFERENTIAL/PLATELET
BASOS PCT: 0 % (ref 0–1)
Basophils Absolute: 0 10*3/uL (ref 0.0–0.1)
Eosinophils Absolute: 0.2 10*3/uL (ref 0.0–0.7)
Eosinophils Relative: 3 % (ref 0–5)
HEMATOCRIT: 40 % (ref 39.0–52.0)
Hemoglobin: 13.9 g/dL (ref 13.0–17.0)
LYMPHS PCT: 24 % (ref 12–46)
Lymphs Abs: 1.9 10*3/uL (ref 0.7–4.0)
MCH: 32.3 pg (ref 26.0–34.0)
MCHC: 34.8 g/dL (ref 30.0–36.0)
MCV: 92.8 fL (ref 78.0–100.0)
MONO ABS: 0.8 10*3/uL (ref 0.1–1.0)
Monocytes Relative: 10 % (ref 3–12)
NEUTROS ABS: 5 10*3/uL (ref 1.7–7.7)
NEUTROS PCT: 63 % (ref 43–77)
Platelets: 217 10*3/uL (ref 150–400)
RBC: 4.31 MIL/uL (ref 4.22–5.81)
RDW: 13.3 % (ref 11.5–15.5)
WBC: 7.9 10*3/uL (ref 4.0–10.5)

## 2014-02-10 LAB — URINE MICROSCOPIC-ADD ON
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
## 2014-02-10 MED ORDER — OXYCODONE-ACETAMINOPHEN 5-325 MG PO TABS
1.0000 | ORAL_TABLET | Freq: Once | ORAL | Status: AC
  Administered 2014-02-10: 1 via ORAL
  Filled 2014-02-10: qty 1

## 2014-02-10 MED ORDER — CEPHALEXIN 500 MG PO CAPS: 500.0000 mg | ORAL_CAPSULE | Freq: Four times a day (QID) | ORAL | Status: DC

## 2014-02-10 MED ORDER — METHOCARBAMOL 500 MG PO TABS: 500.0000 mg | ORAL_TABLET | Freq: Three times a day (TID) | ORAL | Status: DC

## 2014-02-10 MED ORDER — OXYCODONE-ACETAMINOPHEN 5-325 MG PO TABS: 1.0000 | ORAL_TABLET | Freq: Four times a day (QID) | ORAL | Status: DC | PRN

## 2014-02-10 MED ORDER — METHOCARBAMOL 500 MG PO TABS
1000.0000 mg | ORAL_TABLET | Freq: Once | ORAL | Status: AC
  Administered 2014-02-10: 1000 mg via ORAL
  Filled 2014-02-10: qty 2

## 2014-02-10 MED ORDER — CIPROFLOXACIN HCL 500 MG PO TABS
500.0000 mg | ORAL_TABLET | Freq: Once | ORAL | Status: AC
  Administered 2014-02-10: 500 mg via ORAL
  Filled 2014-02-10: qty 1
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
## 2014-02-13 LAB — URINE CULTURE: Colony Count: >=100000

## 2014-02-13 NOTE — ED Provider Notes (Signed)
Medical screening examination/treatment/procedure(s) were performed by non-physician practitioner and as supervising physician I was immediately available for consultation/collaboration.   EKG Interpretation None       Raeford RazorStephen Aliese Brannum, MD 02/13/14 [phone]

## 2014-11-03 ENCOUNTER — Inpatient Hospital Stay: Admission: RE | Admit: 2014-11-03 | Payer: Federal, State, Local not specified - PPO | Source: Ambulatory Visit

## 2016-02-07 ENCOUNTER — Emergency Department (HOSPITAL_COMMUNITY): Payer: Federal, State, Local not specified - PPO

## 2016-02-07 ENCOUNTER — Encounter (HOSPITAL_COMMUNITY): Payer: Self-pay | Admitting: *Deleted

## 2016-02-07 ENCOUNTER — Emergency Department (HOSPITAL_COMMUNITY)
Admission: EM | Admit: 2016-02-07 | Discharge: 2016-02-08 | Disposition: A | Discharge status: Home / Self Care | Payer: Federal, State, Local not specified - PPO | Attending: Emergency Medicine | Admitting: Emergency Medicine

## 2016-02-07 DIAGNOSIS — J189 Pneumonia, unspecified organism: Secondary | ICD-10-CM

## 2016-02-07 DIAGNOSIS — N390 Urinary tract infection, site not specified: Secondary | ICD-10-CM | POA: Diagnosis not present

## 2016-02-07 DIAGNOSIS — R079 Chest pain, unspecified: Secondary | ICD-10-CM | POA: Diagnosis present

## 2016-02-07 DIAGNOSIS — J159 Unspecified bacterial pneumonia: Secondary | ICD-10-CM | POA: Insufficient documentation

## 2016-02-07 DIAGNOSIS — Z79899 Other long term (current) drug therapy: Secondary | ICD-10-CM | POA: Insufficient documentation

## 2016-02-07 DIAGNOSIS — Z792 Long term (current) use of antibiotics: Secondary | ICD-10-CM | POA: Diagnosis not present

## 2016-02-07 DIAGNOSIS — I10 Essential (primary) hypertension: Secondary | ICD-10-CM | POA: Diagnosis not present

## 2016-02-07 DIAGNOSIS — Z87442 Personal history of urinary calculi: Secondary | ICD-10-CM | POA: Diagnosis not present

## 2016-02-07 LAB — CBC
HCT: 43 % (ref 39.0–52.0)
Hemoglobin: 14.7 g/dL (ref 13.0–17.0)
MCH: 32 pg (ref 26.0–34.0)
MCHC: 34.2 g/dL (ref 30.0–36.0)
MCV: 93.7 fL (ref 78.0–100.0)
Platelets: 214 10*3/uL (ref 150–400)
RBC: 4.59 MIL/uL (ref 4.22–5.81)
RDW: 13.2 % (ref 11.5–15.5)
WBC: 12.2 10*3/uL — AB (ref 4.0–10.5)

## 2016-02-07 LAB — URINE MICROSCOPIC-ADD ON: RBC / HPF: NONE SEEN RBC/hpf (ref 0–5)

## 2016-02-07 LAB — BASIC METABOLIC PANEL
Anion gap: 12 (ref 5–15)
BUN: 24 mg/dL — AB (ref 6–20)
CHLORIDE: 103 mmol/L (ref 101–111)
CO2: 23 mmol/L (ref 22–32)
CREATININE: 1.18 mg/dL (ref 0.61–1.24)
Calcium: 9.2 mg/dL (ref 8.9–10.3)
GFR calc Af Amer: >60 mL/min (ref >60)
GFR calc non Af Amer: >60 mL/min (ref >60)
Glucose, Bld: 165 mg/dL — ABNORMAL HIGH (ref 65–99)
Potassium: 3.8 mmol/L (ref 3.5–5.1)
SODIUM: 138 mmol/L (ref 135–145)

## 2016-02-07 LAB — URINALYSIS, ROUTINE W REFLEX MICROSCOPIC
BILIRUBIN URINE: NEGATIVE
Glucose, UA: NEGATIVE mg/dL
HGB URINE DIPSTICK: NEGATIVE
Ketones, ur: NEGATIVE mg/dL
Nitrite: NEGATIVE
Protein, ur: NEGATIVE mg/dL
Specific Gravity, Urine: 1.024 (ref 1.005–1.030)
pH: 5 (ref 5.0–8.0)

## 2016-02-07 LAB — I-STAT TROPONIN, ED: Troponin i, poc: 0.03 ng/mL (ref 0.00–0.08)

## 2016-02-07 LAB — D-DIMER, QUANTITATIVE: D-Dimer, Quant: 0.38 ug/mL-FEU (ref 0.00–0.50)

## 2016-02-07 IMAGING — CR DG CHEST 2V
2 series · 2 of 2 positions shown · non-contrast
Comparison: CT of the chest performed 10/28/2013, and chest
radiograph performed 09/17/2013

CLINICAL DATA: Acute onset of centralized chest and back pain.
Shortness of breath. Initial encounter.

EXAM:
CHEST  2 VIEW

[chest pa]
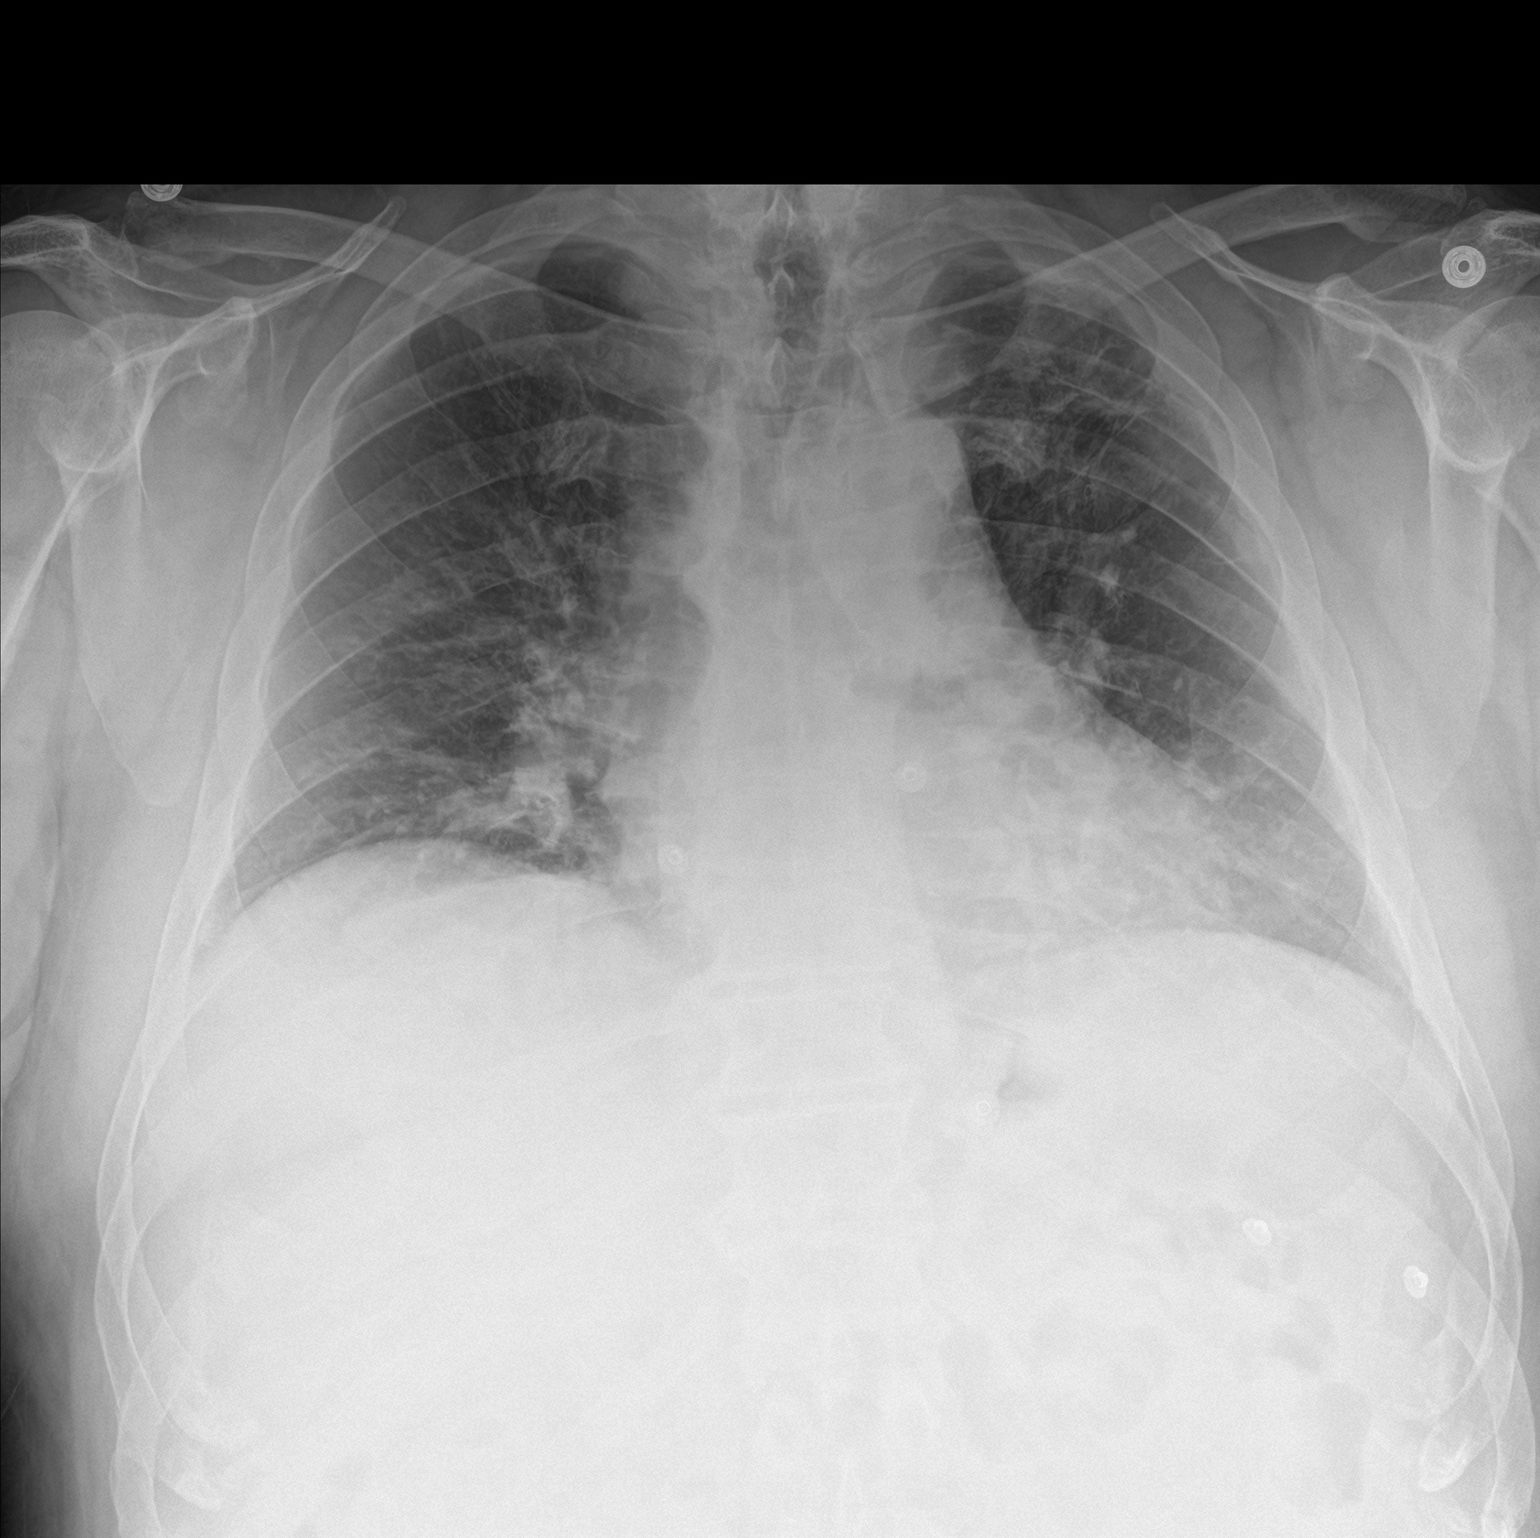

[chest lat]
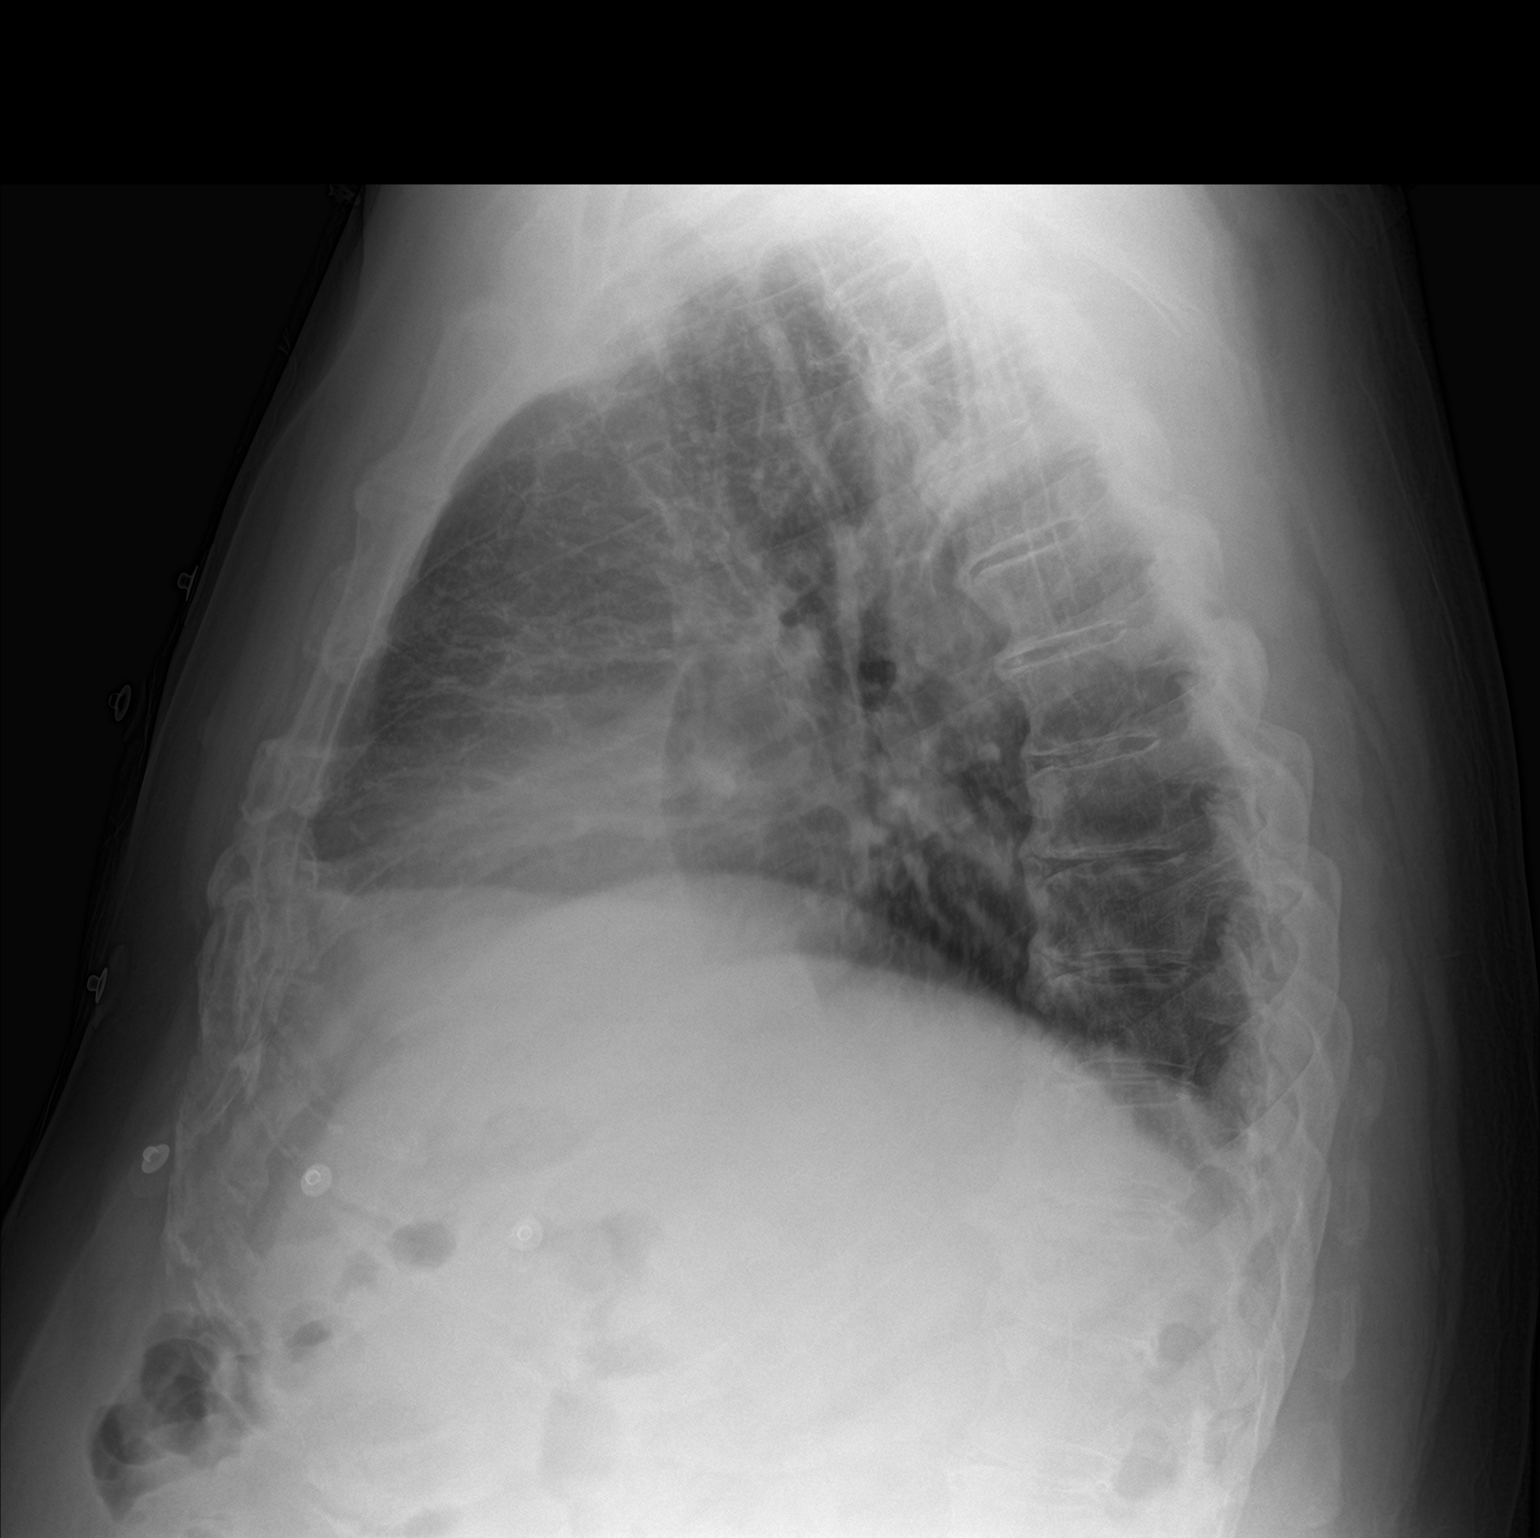

[2 of 2 positions shown; findings below may reference images not displayed]

FINDINGS: The lungs are well-aerated. Mild left basilar opacity could reflect
pneumonia or possibly asymmetric interstitial edema. The lungs are
hypoexpanded, with vascular crowding and mild vascular congestion.
No pleural effusion or pneumothorax is seen.

The heart is normal in size; the mediastinal contour is within
normal limits. No acute osseous abnormalities are seen.
IMPRESSION: Mild left basilar airspace opacity could reflect pneumonia or
possibly asymmetric interstitial edema. Lungs hypoexpanded, with
mild vascular congestion.

## 2016-02-07 MED ORDER — NITROGLYCERIN 0.4 MG SL SUBL
0.4000 mg | SUBLINGUAL_TABLET | SUBLINGUAL | Status: DC | PRN
  Administered 2016-02-07 (×2): 0.4 mg via SUBLINGUAL

## 2016-02-07 MED ORDER — ACETAMINOPHEN 500 MG PO TABS
1000.0000 mg | ORAL_TABLET | Freq: Once | ORAL | Status: AC
  Administered 2016-02-07: 1000 mg via ORAL
  Filled 2016-02-07: qty 2

## 2016-02-07 MED ORDER — IBUPROFEN 800 MG PO TABS
800.0000 mg | ORAL_TABLET | Freq: Once | ORAL | Status: AC
  Administered 2016-02-07: 800 mg via ORAL
  Filled 2016-02-07: qty 1

## 2016-02-07 MED ORDER — OXYCODONE HCL 5 MG PO TABS
5.0000 mg | ORAL_TABLET | Freq: Once | ORAL | Status: AC
  Administered 2016-02-07: 5 mg via ORAL
  Filled 2016-02-07: qty 1

## 2016-02-07 NOTE — ED Notes (Signed)
Lab called with hemolysis of labs, new labs sent

## 2016-02-07 NOTE — ED Provider Notes (Signed)
CSN: [id number]     Arrival date & time 02/07/16  2009 History   First MD Initiated Contact with Patient 02/07/16 2014     Chief Complaint  Patient presents with  . Chest Pain     (Consider location/radiation/quality/duration/timing/severity/associated sxs/prior Treatment) Patient is a 55 y.o. male presenting with chest pain. The history is provided by the patient.  Chest Pain Pain location:  L chest Pain quality: sharp and shooting   Pain radiates to:  Mid back Pain severity:  Moderate Onset quality:  Sudden Duration:  3 days Timing:  Constant Progression:  Worsening Chronicity:  New Relieved by:  Nothing Worsened by:  Movement and certain positions Ineffective treatments:  None tried Associated symptoms: shortness of breath   Associated symptoms: no abdominal pain, no fever, no headache, no palpitations and not vomiting   Risk factors: hypertension and male sex   Risk factors: no coronary artery disease, no diabetes mellitus, no high cholesterol, no prior DVT/PE and no smoking    55 yo M With a chief complaint of left-sided back pain. This been going on for about 3 or 4 days. 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 He states he never got out of his truck used a cup to pee in. He did this for what he said was about 2200 miles. Denies history of PE or DVT in the past. Pain is worse with movement palpation. Radiates up into his anterior chest. Denies lower extremity edema. Denies cough congestion fevers or chills. Has had a history of multiple kidney stones in the past. Feels like this is different to him.  Past Medical History  Diagnosis Date  . Hypertension   . Urethral stricture   . Kidney calculi    Past Surgical History  Procedure Laterality Date  . Appendectomy  38 years ago  . Balloon dilation  08/06/2012    Procedure: BALLOON DILATION;  Surgeon: Anner CreteJohn J Wrenn, MD;  [facility];  Service: Urology;  Laterality: N/A;  . Cystoscopy with urethral dilatation N/A 05/20/2013    Procedure: CYSTOSCOPY WITH URETHRAL DILATATION;  Surgeon: Anner CreteJohn J Wrenn, MD;  Location: [facility];  Service: Urology;  Laterality: N/A;  . Cystoscopy with urethral dilatation N/A 09/17/2013    Procedure: CYSTOSCOPY WITH URETHRAL DILATATION AND RETROGRADE URETHROGRAM;  Surgeon: Sebastian Acheheodore Manny, MD;  Location: [facility];  Service: Urology;  Laterality: N/A;   No family history on file. Social History  Substance Use Topics  . Smoking status: Never Smoker   . Smokeless tobacco: Never Used  . Alcohol Use: No    Review of Systems  Constitutional: Negative for fever and chills.  HENT: Negative for congestion and facial swelling.   Eyes: Negative for discharge and visual disturbance.  Respiratory: Positive for shortness of breath.   Cardiovascular: Positive for chest pain. Negative for palpitations.  Gastrointestinal: Negative for vomiting, abdominal pain and diarrhea.  Musculoskeletal: Negative for myalgias and arthralgias.  Skin: Negative for color change and rash.  Neurological: Negative for tremors, syncope and headaches.  Psychiatric/Behavioral: Negative for confusion and dysphoric mood.      Allergies  Codeine; Erythromycin; and Sulfa antibiotics  Home Medications   Prior to Admission medications   Medication Sig Start Date End Date Taking? Authorizing Provider  acetaminophen-codeine (TYLENOL #3) 300-30 MG per tablet Take 1 tablet by mouth every 4 (four) hours as needed for moderate pain.  Historical Provider, MD  cephALEXin (KEFLEX) 500 MG capsule Take 1 capsule (500 mg total) by mouth 4 (four) times daily. 02/10/14   Ivery Quale, PA-C  lisinopril (PRINIVIL,ZESTRIL) 10 MG tablet Take 10 mg by mouth daily.    Historical Provider, MD  methocarbamol (ROBAXIN) 500 MG tablet Take 1 tablet (500 mg total) by mouth 3 (three) times daily. 02/10/14   Ivery Quale, PA-C   oxyCODONE-acetaminophen (ROXICET) 5-325 MG per tablet Take 1 tablet by mouth every 6 (six) hours as needed for severe pain. 02/10/14   Ivery Quale, PA-C  sildenafil (VIAGRA) 100 MG tablet Take 100 mg by mouth as needed for erectile dysfunction.    Historical Provider, MD  zolpidem (AMBIEN) 10 MG tablet Take 10 mg by mouth at bedtime.     Historical Provider, MD   There were no vitals taken for this visit. Physical Exam  Constitutional: He is oriented to person, place, and time. He appears well-developed and well-nourished.  HENT:  Head: Normocephalic and atraumatic.  Eyes: EOM are normal. Pupils are equal, round, and reactive to light.  Neck: Normal range of motion. Neck supple. No JVD present.  Cardiovascular: Normal rate and regular rhythm.  Exam reveals no gallop and no friction rub.   No murmur heard. Pulmonary/Chest: No respiratory distress. He has no wheezes.  Abdominal: He exhibits no distension. There is no tenderness. There is no rebound and no guarding.  Musculoskeletal: Normal range of motion. He exhibits tenderness (Tender palpation about the left CVA.).  Neurological: He is alert and oriented to person, place, and time.  Skin: No rash noted. No pallor.  Psychiatric: He has a normal mood and affect. His behavior is normal.  Nursing note and vitals reviewed.   ED Course  Procedures (including critical care time) Labs Review Labs Reviewed  BASIC METABOLIC PANEL  CBC  D-DIMER, QUANTITATIVE (NOT AT [facility])  URINALYSIS, ROUTINE W REFLEX MICROSCOPIC (NOT AT [facility])  CK  I-STAT TROPOININ, ED    Imaging Review No results found. I have personally reviewed and evaluated these images and lab results as part of my medical decision-making.   EKG Interpretation   Date/Time:  Sunday February 07 2016 20:15:58 EDT Ventricular Rate:  74 PR Interval:  170 QRS Duration: 107 QT Interval:  510 QTC Calculation: 566 R Axis:   -41 Text Interpretation:  Sinus rhythm Consider left atrial  enlargement Left  axis deviation No significant change since last tracing Confirmed by Maxtyn Nuzum  MD, Reuel Boom ([id number]) on 02/07/2016 8:22:15 PM      MDM   Final diagnoses:  Chest pain, unspecified chest pain type    55 yo M with a chief complaint of left flank pain. This radiates up to his chest. Patient was recently incarcerated and told him that his symptoms today. Feel that the pain is likely musculoskeletal is reproduced with palpation. Patient however has a history of multiple kidney stones as well as had prolonged immobilization. Patient is tachypnic in the room into the 30s. Will obtain a d-dimer chest x-ray UA.  CXR unremarkable, ddimer negative.  Trop negative.    Pain is most likely muscuuloskeletal by hx and PE, trop ordered by triage and negative.  Feel unlikely to be cardiac in nature and see no need in repeat trop as pain is back pain that radiates to the chest.   D-dimer is negative. CXR concerning for pna, ua looks like may be infected.  Hx of recurrent uti's in the past. Discharge home.    I  have discussed the diagnosis/risks/treatment options with the patient and family and believe the pt to be eligible for discharge home to follow-up with PCP. We also discussed returning to the ED immediately if new or worsening sx occur. We discussed the sx which are most concerning (e.g., sudden worsening pain, fever, inability to tolerate by mouth) that necessitate immediate return. Medications administered to the patient during their visit and any new prescriptions provided to the patient are listed below.  Medications given during this visit Medications  acetaminophen (TYLENOL) tablet 1,000 mg (1,000 mg Oral Given 02/07/16 2233)  ibuprofen (ADVIL,MOTRIN) tablet 800 mg (800 mg Oral Given 02/07/16 2233)  oxyCODONE (Oxy IR/ROXICODONE) immediate release tablet 5 mg (5 mg Oral Given 02/07/16 2233)    Discharge Medication List as of 02/08/2016 12:04 AM    START taking these medications   Details   levofloxacin (LEVAQUIN) 750 MG tablet Take 1 tablet (750 mg total) by mouth daily., Starting 02/08/2016, Until Discontinued, Print        The patient appears reasonably screen and/or stabilized for discharge and I doubt any other medical condition or other [facility] requiring further screening, evaluation, or treatment in the ED at this time prior to discharge.    Melene Plan, DO 02/08/16 2348

## 2016-02-07 NOTE — ED Notes (Signed)
Report from EMS.Marland Kitchen.Marland Kitchen.[facility] is from [facility], reports cp since Thursday, vss en route to facility, IV established, 12lead normal, 324 asa administered, cbg 155,

## 2016-02-07 NOTE — ED Notes (Signed)
Patient transported to X-ray 

## 2016-02-07 NOTE — ED Notes (Signed)
MD at bedside. 

## 2016-02-08 MED ORDER — LEVOFLOXACIN 750 MG PO TABS: 750.0000 mg | ORAL_TABLET | Freq: Every day | ORAL | Status: AC

## 2016-02-08 NOTE — Discharge Instructions (Signed)

## 2016-02-09 LAB — URINE CULTURE

## 2017-03-05 ENCOUNTER — Encounter (HOSPITAL_BASED_OUTPATIENT_CLINIC_OR_DEPARTMENT_OTHER): Payer: Self-pay | Admitting: *Deleted

## 2017-03-05 ENCOUNTER — Emergency Department (HOSPITAL_BASED_OUTPATIENT_CLINIC_OR_DEPARTMENT_OTHER): Payer: Federal, State, Local not specified - PPO

## 2017-03-05 ENCOUNTER — Emergency Department (HOSPITAL_BASED_OUTPATIENT_CLINIC_OR_DEPARTMENT_OTHER)
Admission: EM | Admit: 2017-03-05 | Discharge: 2017-03-06 | Disposition: A | Discharge status: Home / Self Care | Payer: Federal, State, Local not specified - PPO | Attending: Emergency Medicine | Admitting: Emergency Medicine

## 2017-03-05 DIAGNOSIS — Z79899 Other long term (current) drug therapy: Secondary | ICD-10-CM | POA: Diagnosis not present

## 2017-03-05 DIAGNOSIS — R1013 Epigastric pain: Secondary | ICD-10-CM | POA: Diagnosis not present

## 2017-03-05 DIAGNOSIS — R079 Chest pain, unspecified: Secondary | ICD-10-CM | POA: Diagnosis present

## 2017-03-05 DIAGNOSIS — I10 Essential (primary) hypertension: Secondary | ICD-10-CM | POA: Insufficient documentation

## 2017-03-05 DIAGNOSIS — F1410 Cocaine abuse, uncomplicated: Secondary | ICD-10-CM | POA: Diagnosis not present

## 2017-03-05 LAB — PROTIME-INR
INR: 0.96
PROTHROMBIN TIME: 12.8 s (ref 11.4–15.2)

## 2017-03-05 LAB — CBC WITH DIFFERENTIAL/PLATELET
Basophils Absolute: 0 10*3/uL (ref 0.0–0.1)
Basophils Relative: 0 %
Eosinophils Absolute: 0.1 10*3/uL (ref 0.0–0.7)
Eosinophils Relative: 1 %
HEMATOCRIT: 46.6 % (ref 39.0–52.0)
HEMOGLOBIN: 15.9 g/dL (ref 13.0–17.0)
LYMPHS ABS: 1.6 10*3/uL (ref 0.7–4.0)
LYMPHS PCT: 18 %
MCH: 32 pg (ref 26.0–34.0)
MCHC: 34.1 g/dL (ref 30.0–36.0)
MCV: 93.8 fL (ref 78.0–100.0)
MONO ABS: 1.1 10*3/uL — AB (ref 0.1–1.0)
MONOS PCT: 13 %
NEUTROS ABS: 6.1 10*3/uL (ref 1.7–7.7)
Neutrophils Relative %: 68 %
PLATELETS: 179 10*3/uL (ref 150–400)
RBC: 4.97 MIL/uL (ref 4.22–5.81)
RDW: 12.9 % (ref 11.5–15.5)
WBC: 8.9 10*3/uL (ref 4.0–10.5)

## 2017-03-05 LAB — COMPREHENSIVE METABOLIC PANEL
ALT: 17 U/L (ref 17–63)
AST: 22 U/L (ref 15–41)
Albumin: 4.1 g/dL (ref 3.5–5.0)
Alkaline Phosphatase: 50 U/L (ref 38–126)
Anion gap: 12 (ref 5–15)
BILIRUBIN TOTAL: 1 mg/dL (ref 0.3–1.2)
BUN: 18 mg/dL (ref 6–20)
CHLORIDE: 100 mmol/L — AB (ref 101–111)
CO2: 26 mmol/L (ref 22–32)
CREATININE: 1.07 mg/dL (ref 0.61–1.24)
Calcium: 9.1 mg/dL (ref 8.9–10.3)
Glucose, Bld: 174 mg/dL — ABNORMAL HIGH (ref 65–99)
Potassium: 4 mmol/L (ref 3.5–5.1)
Sodium: 138 mmol/L (ref 135–145)
TOTAL PROTEIN: 7.6 g/dL (ref 6.5–8.1)

## 2017-03-05 LAB — I-STAT TROPONIN, ED: Troponin i, poc: 0.01 ng/mL (ref 0.00–0.08)

## 2017-03-05 IMAGING — CT CT CTA ABD/PEL W/CM AND/OR W/O CM
2 of 9 series · 16 of 36 positions shown · IV contrast (APPLIED)
Comparison: None.

CLINICAL DATA: Sharp chest pain and epigastric pain and radiating
to the back, onset at [DATE].

EXAM:
CT ANGIOGRAPHY CHEST AND ABDOMEN
TECHNIQUE: Multidetector CT imaging of the chest and abdomen was performed
using the standard protocol during bolus administration of
intravenous contrast. Multiplanar CT image reconstructions and MIPs
were obtained to evaluate the vascular anatomy.
CONTRAST:  100 mL Isovue 370 intravenous.

[Series 5: axial arterial · axial · arterial · 0.96mm/px · z∈[-453,+147]mm · 15 of 230 slices shown]
[im 15/230  lung]
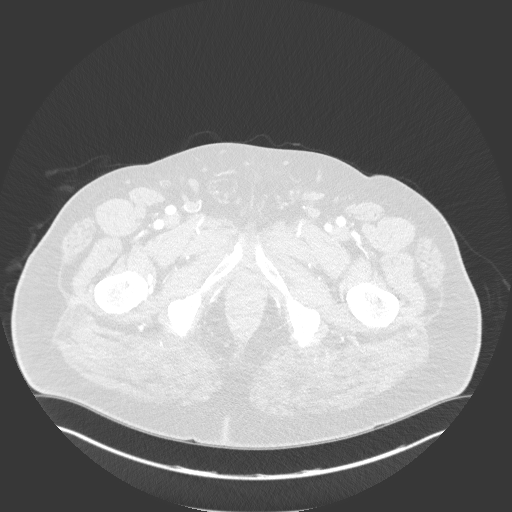
[im 29/230  mediastinal]
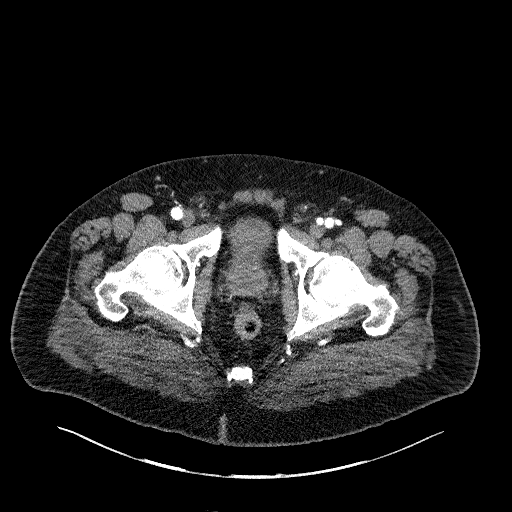
[im 43/230  lung]
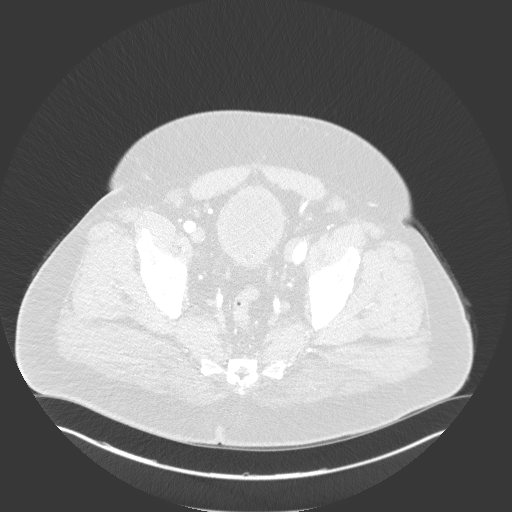
[im 58/230  mediastinal]
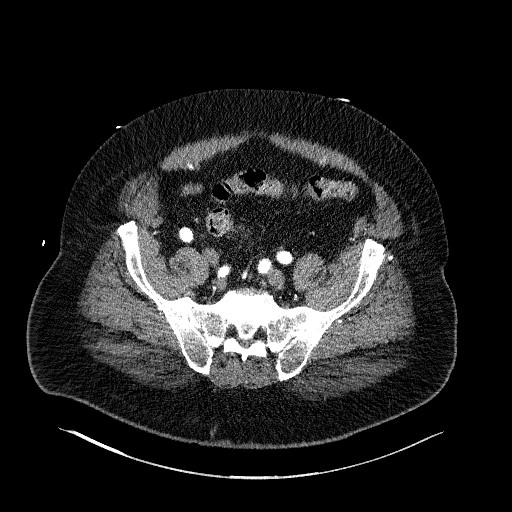
[im 72/230  lung]
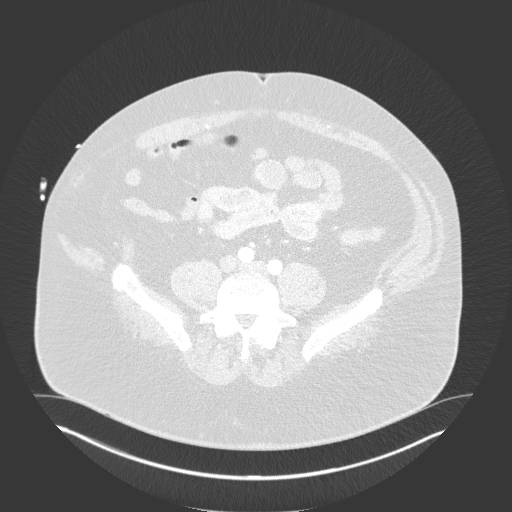
[im 86/230  mediastinal]
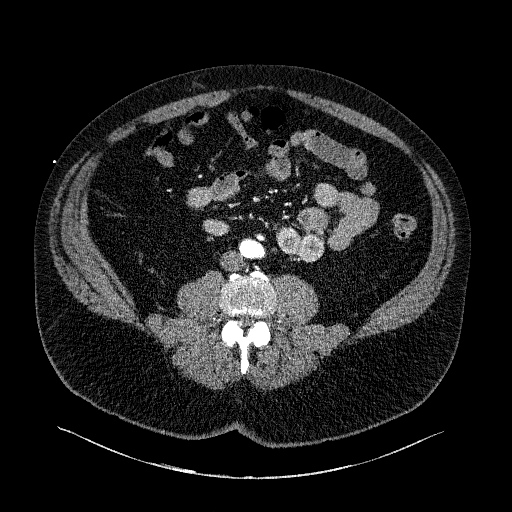
[im 101/230  lung]
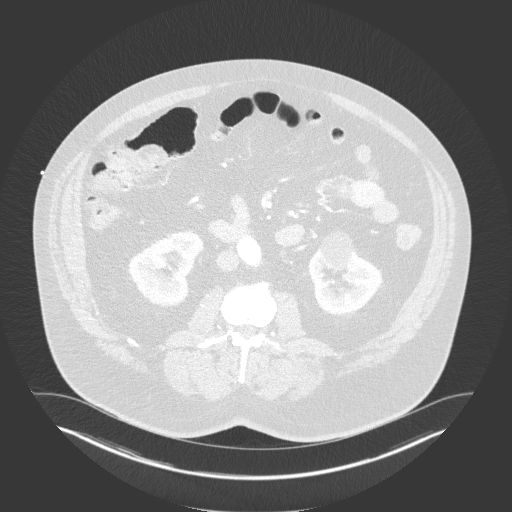
[im 115/230  mediastinal]
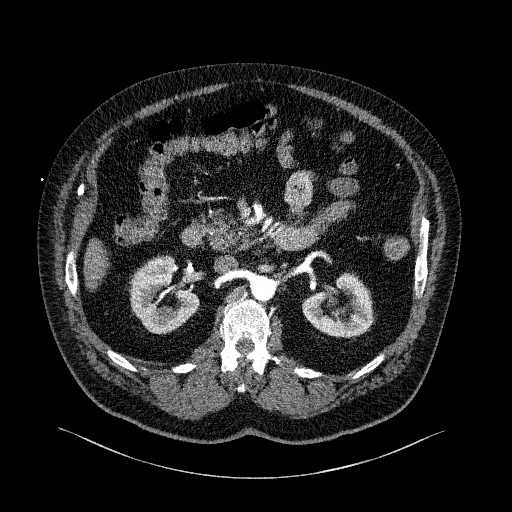
[im 129/230  lung]
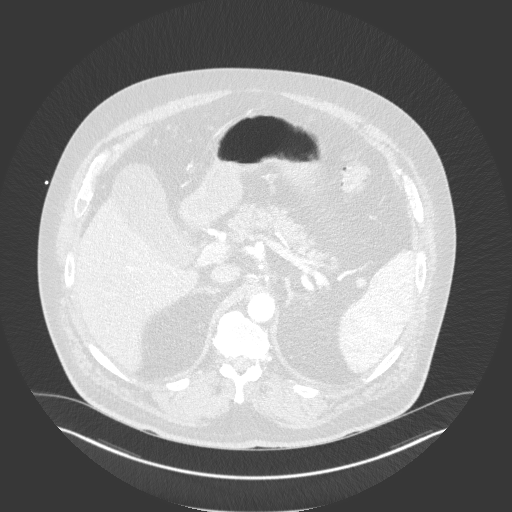
[im 144/230  mediastinal]
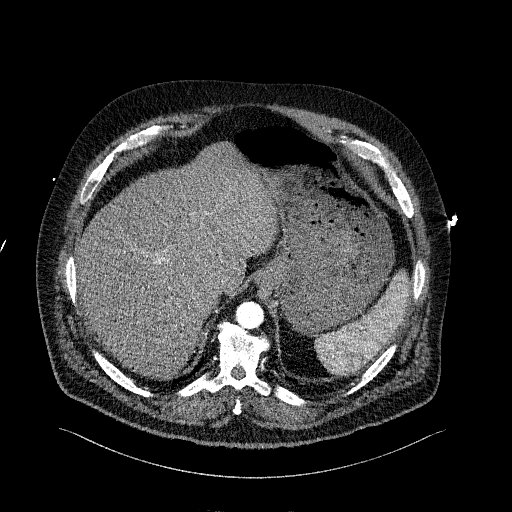
[im 158/230  lung]
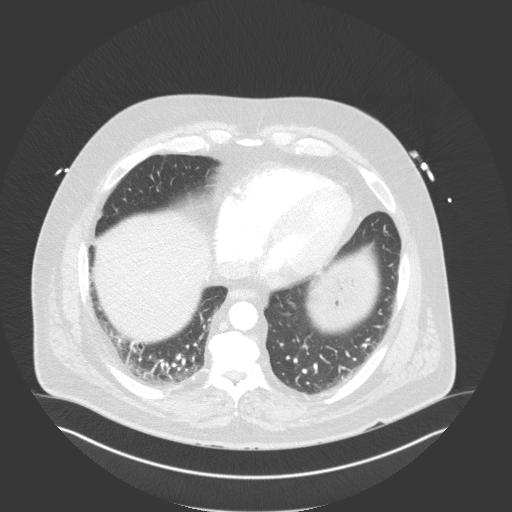
[im 172/230  mediastinal]
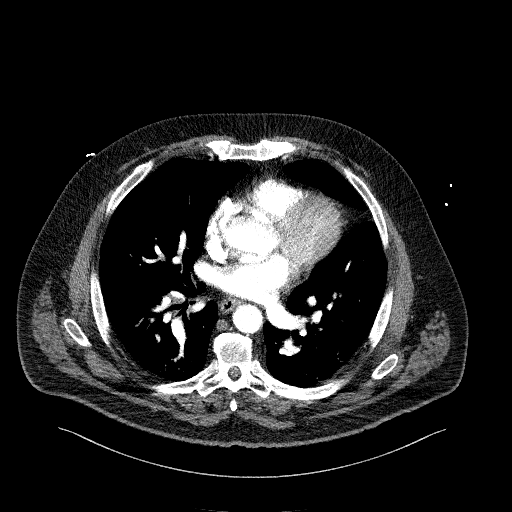
[im 187/230  lung]
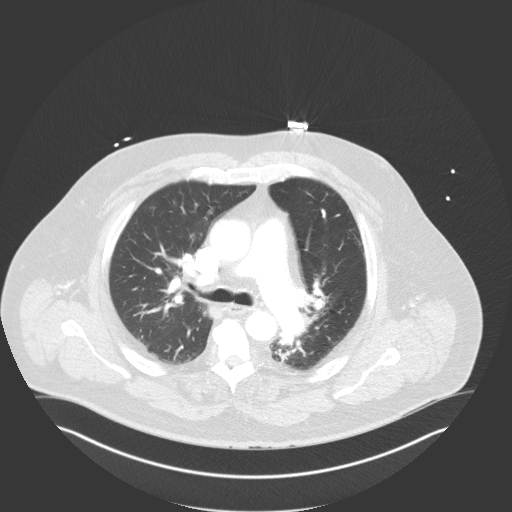
[im 201/230  mediastinal]
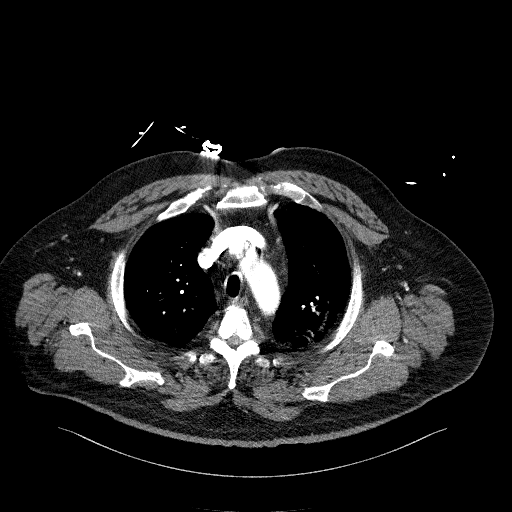
[im 215/230  lung]
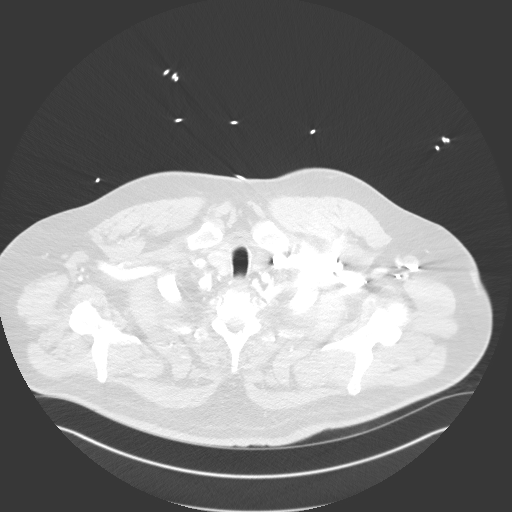

[Series 7: coronals · coronal · 0.86mm/px · 1 of 220 slices shown]
[im 110/220  mediastinal]
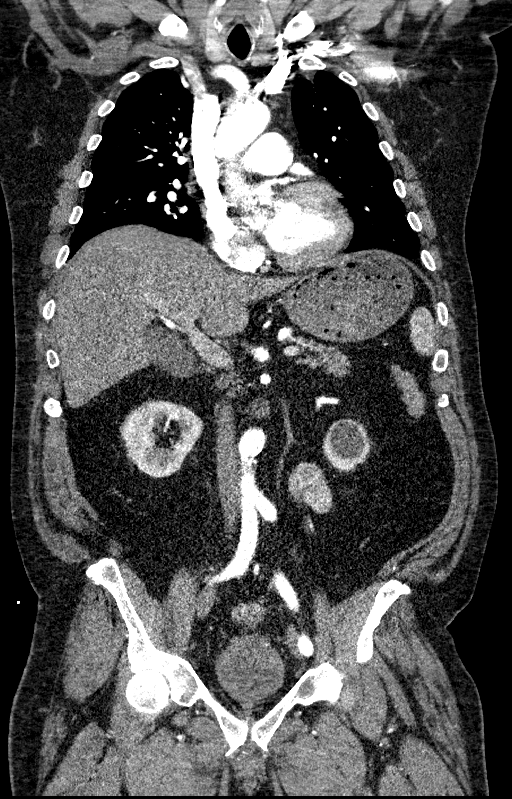

[16 of 36 positions shown; findings below may reference images not displayed]

FINDINGS: CTA CHEST FINDINGS

Cardiovascular: Preferential opacification of the thoracic aorta. No
evidence of thoracic aortic aneurysm or dissection. Normal heart
size. No pericardial effusion.

Mediastinum/Nodes: No enlarged mediastinal, hilar, or axillary lymph
nodes. Thyroid gland, trachea, and esophagus demonstrate no
significant findings.

Lungs/Pleura: Stable scarring in the left lung. No confluent
airspace consolidation. Airways are patent. No pleural effusion.

Musculoskeletal: No chest wall abnormality. No acute or significant
osseous findings.

Review of the MIP images confirms the above findings.

CTA ABDOMEN FINDINGS

Aorta: Normal caliber aorta without aneurysm, dissection, vasculitis
or significant stenosis.

Celiac: Patent without evidence of aneurysm, dissection, vasculitis
or significant stenosis.

SMA: Patent without evidence of aneurysm, dissection, vasculitis or
significant stenosis.

Renals: Single main renal artery bilaterally. Both renal arteries
are patent without evidence of aneurysm, dissection, vasculitis,
fibromuscular dysplasia or significant stenosis.

IMA: Patent without evidence of aneurysm, dissection, vasculitis or
significant stenosis.

Inflow: Patent without evidence of aneurysm, dissection, vasculitis
or significant stenosis.

Veins: No obvious venous abnormality within the limitations of this
arterial phase study.

Hepatobiliary: Mild hepatic steatosis. Gallbladder and bile ducts
are unremarkable.

Pancreas: No significant pancreatic lesion. No pancreatic duct
dilatation. No inflammation.

Spleen: Normal splenic size and morphology.  No focal lesions.

Adrenals/Urinary Tract: Both adrenals are normal. 3 cm lower pole
left renal cyst. No significant renal parenchymal lesion. Collecting
systems and ureters are unremarkable. Urinary bladder is
unremarkable.

Stomach/Bowel: Stomach, small bowel and colon are unremarkable.

Lymphatic: No pathologic adenopathy.

Musculoskeletal: No significant skeletal lesion.

Other: No focal inflammation.  No ascites.

Review of the MIP images confirms the above findings.
IMPRESSION: 1. Normal caliber thoracic and abdominal aorta. No evidence of
dissection or aneurysm. No significant stenosis.
2. Mild hepatic steatosis.
3. Stable scarring of the left lung.
4. No acute findings are evident.

## 2017-03-05 MED ORDER — SODIUM CHLORIDE 0.9 % IV BOLUS (SEPSIS)
1000.0000 mL | Freq: Once | INTRAVENOUS | Status: AC
  Administered 2017-03-05: 1000 mL via INTRAVENOUS

## 2017-03-05 MED ORDER — MORPHINE SULFATE (PF) 4 MG/ML IV SOLN
6.0000 mg | Freq: Once | INTRAVENOUS | Status: AC
  Administered 2017-03-05: 6 mg via INTRAVENOUS
  Filled 2017-03-05: qty 2

## 2017-03-05 MED ORDER — ONDANSETRON HCL 4 MG/2ML IJ SOLN
4.0000 mg | Freq: Once | INTRAMUSCULAR | Status: AC
  Administered 2017-03-05: 4 mg via INTRAVENOUS

## 2017-03-05 MED ORDER — IOPAMIDOL (ISOVUE-370) INJECTION 76%
100.0000 mL | Freq: Once | INTRAVENOUS | Status: AC | PRN
  Administered 2017-03-05: 100 mL via INTRAVENOUS

## 2017-03-05 MED ORDER — ONDANSETRON HCL 4 MG/2ML IJ SOLN
INTRAMUSCULAR | Status: AC
  Administered 2017-03-05: 4 mg via INTRAVENOUS
  Filled 2017-03-05: qty 2

## 2017-03-05 MED ORDER — NITROGLYCERIN 0.4 MG SL SUBL
0.4000 mg | SUBLINGUAL_TABLET | SUBLINGUAL | Status: AC | PRN
  Administered 2017-03-05 (×3): 0.4 mg via SUBLINGUAL
  Filled 2017-03-05: qty 1

## 2017-03-05 NOTE — ED Provider Notes (Addendum)
[facility] Provider Note   CSN: [id number] Arrival date & time: 03/05/17  2305   By signing my name below, I, [name], attest that this documentation has been prepared under the direction and in the presence of [name], MD . Electronically Signed: [name], ED Scribe. 03/05/2017. 11:15 PM.   History   Chief Complaint Chief Complaint  Patient presents with  . Chest Pain    The history is provided by the patient. No language interpreter was used.   HPI Comments:  Derek Dean is a 56 y.o. male with PMHx of HTN who presents to the Emergency Department complaining of constant sharp chest pain that began at 2000 today. Pt states that the pain began suddenly while he was sitting on his bed. It radiates to his back.  He denies any previous similar pain. Pt took Ambien just prior to onset. Denies any hx of GERD. No alleviating factors noted. Pt denies other associated symptoms.   Past Medical History:  Diagnosis Date  . Hypertension   . Kidney calculi   . Urethral stricture     Patient Active Problem List   Diagnosis Date Noted  . Solitary pulmonary nodule 10/25/2013    Past Surgical History:  Procedure Laterality Date  . APPENDECTOMY  38 years ago  . BALLOON DILATION  08/06/2012   Procedure: BALLOON DILATION;  Surgeon: Anner Crete, MD;  Location: [facility];  Service: Urology;  Laterality: N/A;  . CYSTOSCOPY WITH URETHRAL DILATATION N/A 05/20/2013   Procedure: CYSTOSCOPY WITH URETHRAL DILATATION;  Surgeon: Anner Crete, MD;  Location: [facility];  Service: Urology;  Laterality: N/A;  . CYSTOSCOPY WITH URETHRAL DILATATION N/A 09/17/2013   Procedure: CYSTOSCOPY WITH URETHRAL DILATATION AND RETROGRADE URETHROGRAM;  Surgeon: Sebastian Ache, MD;  Location: [facility];  Service: Urology;  Laterality: N/A;       Home Medications    Prior to Admission medications   Medication Sig Start Date End Date Taking? Authorizing Provider    acetaminophen-codeine (TYLENOL #3) 300-30 MG per tablet Take 1 tablet by mouth every 4 (four) hours as needed for moderate pain.    Historical Provider, MD  cyclobenzaprine (FLEXERIL) 10 MG tablet Take 10 mg by mouth 3 (three) times daily as needed for muscle spasms.    Historical Provider, MD  fluticasone (FLONASE) 50 MCG/ACT nasal spray Place 2 sprays into both nostrils daily as needed for allergies or rhinitis.    Historical Provider, MD  levofloxacin (LEVAQUIN) 750 MG tablet Take 1 tablet (750 mg total) by mouth daily. 02/08/16   Melene Plan, DO  lisinopril-hydrochlorothiazide (PRINZIDE,ZESTORETIC) 10-12.5 MG tablet Take 1 tablet by mouth daily.    Historical Provider, MD  oxymetazoline (VICKS SINEX) 0.05 % nasal spray Place 1 spray into both nostrils 2 (two) times daily as needed for congestion.    Historical Provider, MD  sildenafil (VIAGRA) 100 MG tablet Take 100 mg by mouth as needed for erectile dysfunction.    Historical Provider, MD  zolpidem (AMBIEN) 10 MG tablet Take 10 mg by mouth at bedtime.     Historical Provider, MD    Family History No family history on file.  Social History Social History  Substance Use Topics  . Smoking status: Never Smoker  . Smokeless tobacco: Never Used  . Alcohol use No     Allergies   Codeine; Erythromycin; and Sulfa antibiotics   Review of Systems Review of Systems All systems reviewed and are negative for acute  change except as noted in the HPI.  Physical Exam Updated Vital Signs BP (!) 185/109   Pulse 70   Temp 98.3 F (36.8 C) (Oral)   Resp (!) 24   SpO2 98%   Physical Exam  Constitutional: He is oriented to person, place, and time. Vital signs are normal. He appears well-developed and well-nourished.  Non-toxic appearance. He does not appear ill. No distress.  Distressed.  HENT:  Head: Normocephalic and atraumatic.  Nose: Nose normal.  Mouth/Throat: Oropharynx is clear and moist. No oropharyngeal exudate.  Eyes: Conjunctivae  and EOM are normal. Pupils are equal, round, and reactive to light. No scleral icterus.  Neck: Normal range of motion. Neck supple. No tracheal deviation, no edema, no erythema and normal range of motion present. No thyroid mass and no thyromegaly present.  Cardiovascular: Normal rate, regular rhythm, S1 normal, S2 normal, normal heart sounds, intact distal pulses and normal pulses.  Exam reveals no gallop and no friction rub.   No murmur heard. Pulmonary/Chest: Effort normal and breath sounds normal. No respiratory distress. He has no wheezes. He has no rhonchi. He has no rales.  Abdominal: Soft. Normal appearance and bowel sounds are normal. He exhibits no distension, no ascites and no mass. There is no hepatosplenomegaly. There is no tenderness. There is no rebound, no guarding and no CVA tenderness.  Mid epigastric TTP.  Musculoskeletal: Normal range of motion. He exhibits no edema or tenderness.  Lymphadenopathy:    He has no cervical adenopathy.  Neurological: He is alert and oriented to person, place, and time. He has normal strength. No cranial nerve deficit or sensory deficit.  Skin: Skin is warm, dry and intact. No petechiae and no rash noted. He is not diaphoretic. No erythema. No pallor.  Nursing note and vitals reviewed.    ED Treatments / Results  DIAGNOSTIC STUDIES:  Oxygen Saturation is 100% on NCO2, normal by my interpretation.    COORDINATION OF CARE:  11:14 PM Discussed treatment plan with pt at bedside and pt agreed to plan.   Labs (all labs ordered are listed, but only abnormal results are displayed) Labs Reviewed  CBC WITH DIFFERENTIAL/PLATELET - Abnormal; Notable for the following:       Result Value   Monocytes Absolute 1.1 (*)    All other components within normal limits  COMPREHENSIVE METABOLIC PANEL - Abnormal; Notable for the following:    Chloride 100 (*)    Glucose, Bld 174 (*)    All other components within normal limits  RAPID URINE DRUG SCREEN,  HOSP PERFORMED - Abnormal; Notable for the following:    Opiates POSITIVE (*)    Cocaine POSITIVE (*)    All other components within normal limits  URINALYSIS, ROUTINE W REFLEX MICROSCOPIC - Abnormal; Notable for the following:    Specific Gravity, Urine >1.046 (*)    Ketones, ur 15 (*)    Leukocytes, UA SMALL (*)    All other components within normal limits  URINALYSIS, MICROSCOPIC (REFLEX) - Abnormal; Notable for the following:    Bacteria, UA RARE (*)    Squamous Epithelial / LPF 0-5 (*)    All other components within normal limits  PROTIME-INR  TROPONIN I  I-STAT TROPOININ, ED    EKG  EKG Interpretation  Date/Time:  Monday March 06 2017 02:22:20 EDT Ventricular Rate:  72 PR Interval:    QRS Duration: 100 QT Interval:  422 QTC Calculation: 462 R Axis:   53 Text Interpretation:  Sinus arrhythmia No  significant change since last tracing Confirmed by Erroll Luna [phone]) on 03/06/2017 2:33:22 AM       Radiology Ct Angio Chest Aorta W &/or Wo Contrast  Result Date: 03/06/2017 CLINICAL DATA:  Sharp chest pain and epigastric pain and radiating to the back, onset at 20:00. EXAM: CT ANGIOGRAPHY CHEST AND ABDOMEN TECHNIQUE: Multidetector CT imaging of the chest and abdomen was performed using the standard protocol during bolus administration of intravenous contrast. Multiplanar CT image reconstructions and MIPs were obtained to evaluate the vascular anatomy. CONTRAST:  100 mL Isovue 370 intravenous. COMPARISON:  None. FINDINGS: CTA CHEST FINDINGS Cardiovascular: Preferential opacification of the thoracic aorta. No evidence of thoracic aortic aneurysm or dissection. Normal heart size. No pericardial effusion. Mediastinum/Nodes: No enlarged mediastinal, hilar, or axillary lymph nodes. Thyroid gland, trachea, and esophagus demonstrate no significant findings. Lungs/Pleura: Stable scarring in the left lung. No confluent airspace consolidation. Airways are patent. No pleural  effusion. Musculoskeletal: No chest wall abnormality. No acute or significant osseous findings. Review of the MIP images confirms the above findings. CTA ABDOMEN FINDINGS Aorta: Normal caliber aorta without aneurysm, dissection, vasculitis or significant stenosis. Celiac: Patent without evidence of aneurysm, dissection, vasculitis or significant stenosis. SMA: Patent without evidence of aneurysm, dissection, vasculitis or significant stenosis. Renals: Single main renal artery bilaterally. Both renal arteries are patent without evidence of aneurysm, dissection, vasculitis, fibromuscular dysplasia or significant stenosis. IMA: Patent without evidence of aneurysm, dissection, vasculitis or significant stenosis. Inflow: Patent without evidence of aneurysm, dissection, vasculitis or significant stenosis. Veins: No obvious venous abnormality within the limitations of this arterial phase study. Hepatobiliary: Mild hepatic steatosis. Gallbladder and bile ducts are unremarkable. Pancreas: No significant pancreatic lesion. No pancreatic duct dilatation. No inflammation. Spleen: Normal splenic size and morphology.  No focal lesions. Adrenals/Urinary Tract: Both adrenals are normal. 3 cm lower pole left renal cyst. No significant renal parenchymal lesion. Collecting systems and ureters are unremarkable. Urinary bladder is unremarkable. Stomach/Bowel: Stomach, small bowel and colon are unremarkable. Lymphatic: No pathologic adenopathy. Musculoskeletal: No significant skeletal lesion. Other: No focal inflammation.  No ascites. Review of the MIP images confirms the above findings. IMPRESSION: 1. Normal caliber thoracic and abdominal aorta. No evidence of dissection or aneurysm. No significant stenosis. 2. Mild hepatic steatosis. 3. Stable scarring of the left lung. 4. No acute findings are evident. Electronically Signed   By: Ellery Plunk M.D.   On: 03/06/2017 00:41   Ct Angio Abd/pel W And/or Wo Contrast  Result Date:  03/06/2017 CLINICAL DATA:  Sharp chest pain and epigastric pain and radiating to the back, onset at 20:00. EXAM: CT ANGIOGRAPHY CHEST AND ABDOMEN TECHNIQUE: Multidetector CT imaging of the chest and abdomen was performed using the standard protocol during bolus administration of intravenous contrast. Multiplanar CT image reconstructions and MIPs were obtained to evaluate the vascular anatomy. CONTRAST:  100 mL Isovue 370 intravenous. COMPARISON:  None. FINDINGS: CTA CHEST FINDINGS Cardiovascular: Preferential opacification of the thoracic aorta. No evidence of thoracic aortic aneurysm or dissection. Normal heart size. No pericardial effusion. Mediastinum/Nodes: No enlarged mediastinal, hilar, or axillary lymph nodes. Thyroid gland, trachea, and esophagus demonstrate no significant findings. Lungs/Pleura: Stable scarring in the left lung. No confluent airspace consolidation. Airways are patent. No pleural effusion. Musculoskeletal: No chest wall abnormality. No acute or significant osseous findings. Review of the MIP images confirms the above findings. CTA ABDOMEN FINDINGS Aorta: Normal caliber aorta without aneurysm, dissection, vasculitis or significant stenosis. Celiac: Patent without evidence of aneurysm, dissection, vasculitis or significant  stenosis. SMA: Patent without evidence of aneurysm, dissection, vasculitis or significant stenosis. Renals: Single main renal artery bilaterally. Both renal arteries are patent without evidence of aneurysm, dissection, vasculitis, fibromuscular dysplasia or significant stenosis. IMA: Patent without evidence of aneurysm, dissection, vasculitis or significant stenosis. Inflow: Patent without evidence of aneurysm, dissection, vasculitis or significant stenosis. Veins: No obvious venous abnormality within the limitations of this arterial phase study. Hepatobiliary: Mild hepatic steatosis. Gallbladder and bile ducts are unremarkable. Pancreas: No significant pancreatic lesion. No  pancreatic duct dilatation. No inflammation. Spleen: Normal splenic size and morphology.  No focal lesions. Adrenals/Urinary Tract: Both adrenals are normal. 3 cm lower pole left renal cyst. No significant renal parenchymal lesion. Collecting systems and ureters are unremarkable. Urinary bladder is unremarkable. Stomach/Bowel: Stomach, small bowel and colon are unremarkable. Lymphatic: No pathologic adenopathy. Musculoskeletal: No significant skeletal lesion. Other: No focal inflammation.  No ascites. Review of the MIP images confirms the above findings. IMPRESSION: 1. Normal caliber thoracic and abdominal aorta. No evidence of dissection or aneurysm. No significant stenosis. 2. Mild hepatic steatosis. 3. Stable scarring of the left lung. 4. No acute findings are evident. Electronically Signed   By: Ellery Plunk M.D.   On: 03/06/2017 00:41    Procedures Procedures (including critical care time)  Medications Ordered in ED Medications  morphine 4 MG/ML injection 6 mg (6 mg Intravenous Given 03/05/17 2324)  sodium chloride 0.9 % bolus 1,000 mL (0 mLs Intravenous Stopped 03/06/17 0157)  nitroGLYCERIN (NITROSTAT) SL tablet 0.4 mg (0.4 mg Sublingual Given 03/05/17 2339)  ondansetron (ZOFRAN) injection 4 mg (4 mg Intravenous Given 03/05/17 2335)  iopamidol (ISOVUE-370) 76 % injection 100 mL (100 mLs Intravenous Contrast Given 03/05/17 2348)  morphine 4 MG/ML injection 6 mg (6 mg Intravenous Given 03/06/17 0107)  gi cocktail (Maalox,Lidocaine,Donnatal) (30 mLs Oral Given 03/06/17 0053)  metoCLOPramide (REGLAN) injection 10 mg (10 mg Intravenous Given 03/06/17 0107)     Initial Impression / Assessment and Plan / ED Course  I have reviewed the triage vital signs and the nursing notes.  Pertinent labs & imaging results that were available during my care of the patient were reviewed by me and considered in my medical decision making (see chart for details).     Patient presents to the ED for midepigastric  abdominal pain radiating to his back.  He is very TTP in this area as well.  His BP is high, but no pulse deficit. Will obtain CT angio for dissection. He was given morphine and nitro for pain/BP control.  EKG does not show any ischemia.  Labs pending.  Will continue to closely monitor on tele  2:38 AM labs and CT scan did not reveal an acute cause for this patient's pain. He was given more morphine and Reglan for symptoms. Will hold for repeat troponin and EKG. We'll continue to closely monitor. Blood pressure is now improved to 150/90.  2:38 AM UDS reveals cocaine.  This could certainly be the cause of his symptoms.  Repeat EKG is unchanged. Troponin is pending.  If negative. Plan on DC home with PCP fu.  Of note, patient denies using any cocaine today.  Final Clinical Impressions(s) / ED Diagnoses   Final diagnoses:  Cocaine abuse    New Prescriptions New Prescriptions   No medications on file    I personally performed the services described in this documentation, which was scribed in my presence. The recorded information has been reviewed and is accurate.  [name], MD 03/06/17 [phone]

## 2017-03-05 NOTE — ED Triage Notes (Addendum)
Pt states that he was going to bed around 8pm and started having a sudden onset of chest pain. Denies any sob but states that he did have an episode of vomiting. Describes as sharp and constant. States that's he did eat a hamburger at the [employer] earlier this evening. Pt moaning in pain on arrival. Pt tender to upper mid abdomen on palpation.

## 2017-03-06 ENCOUNTER — Encounter (HOSPITAL_BASED_OUTPATIENT_CLINIC_OR_DEPARTMENT_OTHER): Payer: Self-pay | Admitting: *Deleted

## 2017-03-06 LAB — URINALYSIS, MICROSCOPIC (REFLEX)

## 2017-03-06 LAB — URINALYSIS, ROUTINE W REFLEX MICROSCOPIC
BILIRUBIN URINE: NEGATIVE
Glucose, UA: NEGATIVE mg/dL
HGB URINE DIPSTICK: NEGATIVE
Ketones, ur: 15 mg/dL — AB
Nitrite: NEGATIVE
PROTEIN: NEGATIVE mg/dL
Specific Gravity, Urine: >1.046 — ABNORMAL HIGH (ref 1.005–1.030)
pH: 5.5 (ref 5.0–8.0)

## 2017-03-06 LAB — RAPID URINE DRUG SCREEN, HOSP PERFORMED
AMPHETAMINES: NOT DETECTED
BENZODIAZEPINES: NOT DETECTED
Barbiturates: NOT DETECTED
Cocaine: POSITIVE — AB
Opiates: POSITIVE — AB
Tetrahydrocannabinol: NOT DETECTED

## 2017-03-06 LAB — TROPONIN I

## 2017-03-06 MED ORDER — KETOROLAC TROMETHAMINE 30 MG/ML IJ SOLN: 15.0000 mg | Freq: Once | INTRAMUSCULAR | Status: DC

## 2017-03-06 MED ORDER — METOCLOPRAMIDE HCL 5 MG/ML IJ SOLN
INTRAMUSCULAR | Status: AC
  Filled 2017-03-06: qty 2

## 2017-03-06 MED ORDER — METOCLOPRAMIDE HCL 5 MG/ML IJ SOLN
10.0000 mg | Freq: Once | INTRAMUSCULAR | Status: AC
Start: 2017-03-06 — End: 2017-03-06
  Administered 2017-03-06: 10 mg via INTRAVENOUS

## 2017-03-06 MED ORDER — GI COCKTAIL ~~LOC~~
30.0000 mL | Freq: Once | ORAL | Status: AC
  Administered 2017-03-06: 30 mL via ORAL
  Filled 2017-03-06: qty 30

## 2017-03-06 MED ORDER — MORPHINE SULFATE (PF) 4 MG/ML IV SOLN
6.0000 mg | Freq: Once | INTRAVENOUS | Status: AC
  Administered 2017-03-06: 6 mg via INTRAVENOUS
  Filled 2017-03-06: qty 2

## 2017-03-06 NOTE — ED Notes (Signed)
Patient transported to CT 

## 2017-03-06 NOTE — ED Notes (Signed)
Pt oxygen saturation 88% on RA. Pt placed on 2L oxygen via [place]. Satuation improved to 91%. Oxygen increased to 3L, saturation improved to 93-94%.

## 2021-12-23 DIAGNOSIS — I10 Essential (primary) hypertension: Secondary | ICD-10-CM | POA: Diagnosis not present

## 2021-12-23 DIAGNOSIS — E119 Type 2 diabetes mellitus without complications: Secondary | ICD-10-CM | POA: Diagnosis not present

## 2021-12-23 DIAGNOSIS — R609 Edema, unspecified: Secondary | ICD-10-CM | POA: Diagnosis not present

## 2022-01-28 DIAGNOSIS — N529 Male erectile dysfunction, unspecified: Secondary | ICD-10-CM | POA: Diagnosis not present

## 2022-01-28 DIAGNOSIS — R350 Frequency of micturition: Secondary | ICD-10-CM | POA: Diagnosis not present

## 2022-01-28 DIAGNOSIS — N35914 Unspecified anterior urethral stricture, male: Secondary | ICD-10-CM | POA: Diagnosis not present

## 2022-01-28 DIAGNOSIS — Z87442 Personal history of urinary calculi: Secondary | ICD-10-CM | POA: Diagnosis not present

## 2022-05-05 DIAGNOSIS — E118 Type 2 diabetes mellitus with unspecified complications: Secondary | ICD-10-CM | POA: Diagnosis not present

## 2022-05-05 DIAGNOSIS — I10 Essential (primary) hypertension: Secondary | ICD-10-CM | POA: Diagnosis not present

## 2022-05-05 DIAGNOSIS — G4700 Insomnia, unspecified: Secondary | ICD-10-CM | POA: Diagnosis not present

## 2022-05-20 DIAGNOSIS — Z87442 Personal history of urinary calculi: Secondary | ICD-10-CM | POA: Diagnosis not present

## 2022-05-20 DIAGNOSIS — N35914 Unspecified anterior urethral stricture, male: Secondary | ICD-10-CM | POA: Diagnosis not present

## 2022-05-20 DIAGNOSIS — Z6841 Body Mass Index (BMI) 40.0 and over, adult: Secondary | ICD-10-CM | POA: Diagnosis not present

## 2022-08-09 DIAGNOSIS — G4700 Insomnia, unspecified: Secondary | ICD-10-CM | POA: Diagnosis not present

## 2022-08-09 DIAGNOSIS — F411 Generalized anxiety disorder: Secondary | ICD-10-CM | POA: Diagnosis not present

## 2022-08-09 DIAGNOSIS — J158 Pneumonia due to other specified bacteria: Secondary | ICD-10-CM | POA: Diagnosis not present

## 2022-08-09 DIAGNOSIS — E118 Type 2 diabetes mellitus with unspecified complications: Secondary | ICD-10-CM | POA: Diagnosis not present

## 2022-09-23 DIAGNOSIS — L900 Lichen sclerosus et atrophicus: Secondary | ICD-10-CM | POA: Diagnosis not present

## 2022-09-23 DIAGNOSIS — N4883 Acquired buried penis: Secondary | ICD-10-CM | POA: Diagnosis not present

## 2022-09-23 DIAGNOSIS — N35914 Unspecified anterior urethral stricture, male: Secondary | ICD-10-CM | POA: Diagnosis not present

## 2022-10-24 DIAGNOSIS — F5101 Primary insomnia: Secondary | ICD-10-CM | POA: Diagnosis not present

## 2022-10-24 DIAGNOSIS — E1165 Type 2 diabetes mellitus with hyperglycemia: Secondary | ICD-10-CM | POA: Diagnosis not present

## 2022-11-15 DIAGNOSIS — J0141 Acute recurrent pansinusitis: Secondary | ICD-10-CM | POA: Diagnosis not present

## 2023-02-03 DIAGNOSIS — L900 Lichen sclerosus et atrophicus: Secondary | ICD-10-CM | POA: Diagnosis not present

## 2023-02-03 DIAGNOSIS — N528 Other male erectile dysfunction: Secondary | ICD-10-CM | POA: Diagnosis not present

## 2023-02-03 DIAGNOSIS — N35914 Unspecified anterior urethral stricture, male: Secondary | ICD-10-CM | POA: Diagnosis not present

## 2023-03-13 DIAGNOSIS — R600 Localized edema: Secondary | ICD-10-CM | POA: Diagnosis not present

## 2023-04-09 DIAGNOSIS — R339 Retention of urine, unspecified: Secondary | ICD-10-CM | POA: Diagnosis not present

## 2023-04-09 DIAGNOSIS — R1084 Generalized abdominal pain: Secondary | ICD-10-CM | POA: Diagnosis not present

## 2023-06-13 DIAGNOSIS — R600 Localized edema: Secondary | ICD-10-CM | POA: Diagnosis not present

## 2023-10-03 DIAGNOSIS — R7309 Other abnormal glucose: Secondary | ICD-10-CM | POA: Diagnosis not present

## 2023-11-09 DIAGNOSIS — J0100 Acute maxillary sinusitis, unspecified: Secondary | ICD-10-CM | POA: Diagnosis not present

## 2024-01-09 DIAGNOSIS — R350 Frequency of micturition: Secondary | ICD-10-CM | POA: Diagnosis not present

## 2024-01-09 DIAGNOSIS — N281 Cyst of kidney, acquired: Secondary | ICD-10-CM | POA: Diagnosis not present

## 2024-01-09 DIAGNOSIS — Z87442 Personal history of urinary calculi: Secondary | ICD-10-CM | POA: Diagnosis not present

## 2024-01-09 DIAGNOSIS — N4883 Acquired buried penis: Secondary | ICD-10-CM | POA: Diagnosis not present

## 2024-01-09 DIAGNOSIS — R351 Nocturia: Secondary | ICD-10-CM | POA: Diagnosis not present

## 2024-01-09 DIAGNOSIS — N35914 Unspecified anterior urethral stricture, male: Secondary | ICD-10-CM | POA: Diagnosis not present

## 2024-01-09 DIAGNOSIS — R3912 Poor urinary stream: Secondary | ICD-10-CM | POA: Diagnosis not present

## 2024-01-19 DIAGNOSIS — Z01818 Encounter for other preprocedural examination: Secondary | ICD-10-CM | POA: Diagnosis not present

## 2024-02-07 DIAGNOSIS — N480 Leukoplakia of penis: Secondary | ICD-10-CM | POA: Diagnosis not present

## 2024-02-07 DIAGNOSIS — Z79899 Other long term (current) drug therapy: Secondary | ICD-10-CM | POA: Diagnosis not present

## 2024-02-07 DIAGNOSIS — L900 Lichen sclerosus et atrophicus: Secondary | ICD-10-CM | POA: Diagnosis not present

## 2024-02-07 DIAGNOSIS — I10 Essential (primary) hypertension: Secondary | ICD-10-CM | POA: Diagnosis not present

## 2024-02-07 DIAGNOSIS — N3289 Other specified disorders of bladder: Secondary | ICD-10-CM | POA: Diagnosis not present

## 2024-02-07 DIAGNOSIS — N35916 Unspecified urethral stricture, male, overlapping sites: Secondary | ICD-10-CM | POA: Diagnosis not present

## 2024-02-07 DIAGNOSIS — N35819 Other urethral stricture, male, unspecified site: Secondary | ICD-10-CM | POA: Diagnosis not present

## 2024-02-07 DIAGNOSIS — N35911 Unspecified urethral stricture, male, meatal: Secondary | ICD-10-CM | POA: Diagnosis not present

## 2024-02-07 DIAGNOSIS — E119 Type 2 diabetes mellitus without complications: Secondary | ICD-10-CM | POA: Diagnosis not present

## 2024-03-11 DIAGNOSIS — E119 Type 2 diabetes mellitus without complications: Secondary | ICD-10-CM | POA: Diagnosis not present

## 2024-06-12 DIAGNOSIS — R051 Acute cough: Secondary | ICD-10-CM | POA: Diagnosis not present

## 2024-06-13 DIAGNOSIS — J0100 Acute maxillary sinusitis, unspecified: Secondary | ICD-10-CM | POA: Diagnosis not present

## 2024-09-09 DIAGNOSIS — Z125 Encounter for screening for malignant neoplasm of prostate: Secondary | ICD-10-CM | POA: Diagnosis not present

## 2024-09-09 DIAGNOSIS — Z0000 Encounter for general adult medical examination without abnormal findings: Secondary | ICD-10-CM | POA: Diagnosis not present

## 2024-09-09 DIAGNOSIS — Z1329 Encounter for screening for other suspected endocrine disorder: Secondary | ICD-10-CM | POA: Diagnosis not present

## 2024-09-09 DIAGNOSIS — E119 Type 2 diabetes mellitus without complications: Secondary | ICD-10-CM | POA: Diagnosis not present

## 2024-09-09 DIAGNOSIS — Z13220 Encounter for screening for lipoid disorders: Secondary | ICD-10-CM | POA: Diagnosis not present

## 2024-10-28 DIAGNOSIS — E119 Type 2 diabetes mellitus without complications: Secondary | ICD-10-CM | POA: Diagnosis not present

## 2024-10-28 DIAGNOSIS — N368 Other specified disorders of urethra: Secondary | ICD-10-CM | POA: Diagnosis not present

## 2024-11-04 DIAGNOSIS — N35914 Unspecified anterior urethral stricture, male: Secondary | ICD-10-CM | POA: Diagnosis not present

## 2024-11-04 DIAGNOSIS — N35916 Unspecified urethral stricture, male, overlapping sites: Secondary | ICD-10-CM | POA: Diagnosis not present

## 2024-11-04 DIAGNOSIS — L900 Lichen sclerosus et atrophicus: Secondary | ICD-10-CM | POA: Diagnosis not present
